# Patient Record
Sex: Female | Born: 1935 | Race: White | Hispanic: No | Smoking: Never smoker
Health system: Southern US, Community
[De-identification: ages and names within clinical notes are randomized; demographics above are authoritative.]

## PROBLEM LIST (undated history)

## (undated) DIAGNOSIS — J449 Chronic obstructive pulmonary disease, unspecified: Secondary | ICD-10-CM

## (undated) DIAGNOSIS — N9489 Other specified conditions associated with female genital organs and menstrual cycle: Secondary | ICD-10-CM

## (undated) DIAGNOSIS — J4489 Other specified chronic obstructive pulmonary disease: Secondary | ICD-10-CM

## (undated) DIAGNOSIS — F329 Major depressive disorder, single episode, unspecified: Secondary | ICD-10-CM

## (undated) DIAGNOSIS — R300 Dysuria: Secondary | ICD-10-CM

## (undated) DIAGNOSIS — K219 Gastro-esophageal reflux disease without esophagitis: Secondary | ICD-10-CM

## (undated) DIAGNOSIS — N952 Postmenopausal atrophic vaginitis: Secondary | ICD-10-CM

## (undated) DIAGNOSIS — F411 Generalized anxiety disorder: Secondary | ICD-10-CM

## (undated) DIAGNOSIS — F0390 Unspecified dementia without behavioral disturbance: Secondary | ICD-10-CM

## (undated) DIAGNOSIS — K5792 Diverticulitis of intestine, part unspecified, without perforation or abscess without bleeding: Secondary | ICD-10-CM

## (undated) DIAGNOSIS — Z8739 Personal history of other diseases of the musculoskeletal system and connective tissue: Secondary | ICD-10-CM

## (undated) DIAGNOSIS — N3020 Other chronic cystitis without hematuria: Secondary | ICD-10-CM

## (undated) DIAGNOSIS — L988 Other specified disorders of the skin and subcutaneous tissue: Secondary | ICD-10-CM

## (undated) DIAGNOSIS — J45909 Unspecified asthma, uncomplicated: Secondary | ICD-10-CM

## (undated) DIAGNOSIS — F3289 Other specified depressive episodes: Secondary | ICD-10-CM

## (undated) DIAGNOSIS — K449 Diaphragmatic hernia without obstruction or gangrene: Secondary | ICD-10-CM

## (undated) DIAGNOSIS — R351 Nocturia: Secondary | ICD-10-CM

## (undated) DIAGNOSIS — R339 Retention of urine, unspecified: Secondary | ICD-10-CM

## (undated) DIAGNOSIS — G4700 Insomnia, unspecified: Secondary | ICD-10-CM

## (undated) DIAGNOSIS — E119 Type 2 diabetes mellitus without complications: Secondary | ICD-10-CM

## (undated) DIAGNOSIS — F068 Other specified mental disorders due to known physiological condition: Secondary | ICD-10-CM

## (undated) HISTORY — DX: Other specified mental disorders due to known physiological condition: F06.8

## (undated) HISTORY — DX: Generalized anxiety disorder: F41.1

## (undated) HISTORY — DX: Other specified depressive episodes: F32.89

## (undated) HISTORY — PX: NISSEN FUNDOPLICATION: SHX2091

## (undated) HISTORY — DX: Gastro-esophageal reflux disease without esophagitis: K21.9

## (undated) HISTORY — DX: Other specified chronic obstructive pulmonary disease: J44.89

## (undated) HISTORY — DX: Personal history of other diseases of the musculoskeletal system and connective tissue: Z87.39

## (undated) HISTORY — DX: Diverticulitis of intestine, part unspecified, without perforation or abscess without bleeding: K57.92

## (undated) HISTORY — DX: Other specified conditions associated with female genital organs and menstrual cycle: N94.89

## (undated) HISTORY — DX: Type 2 diabetes mellitus without complications: E11.9

## (undated) HISTORY — DX: Unspecified asthma, uncomplicated: J45.909

## (undated) HISTORY — PX: OTHER SURGICAL HISTORY: SHX169

## (undated) HISTORY — DX: Other chronic cystitis without hematuria: N30.20

## (undated) HISTORY — DX: Other specified disorders of the skin and subcutaneous tissue: L98.8

## (undated) HISTORY — DX: Insomnia, unspecified: G47.00

## (undated) HISTORY — DX: Retention of urine, unspecified: R33.9

## (undated) HISTORY — DX: Chronic obstructive pulmonary disease, unspecified: J44.9

## (undated) HISTORY — PX: APPENDECTOMY: SHX54

## (undated) HISTORY — DX: Diaphragmatic hernia without obstruction or gangrene: K44.9

## (undated) HISTORY — DX: Nocturia: R35.1

## (undated) HISTORY — DX: Major depressive disorder, single episode, unspecified: F32.9

## (undated) HISTORY — PX: HERNIA REPAIR: SHX51

## (undated) HISTORY — DX: Dysuria: R30.0

## (undated) HISTORY — DX: Postmenopausal atrophic vaginitis: N95.2

## (undated) HISTORY — PX: CYSTOSCOPY: SUR368

## (undated) HISTORY — PX: HEMORRHOID SURGERY: SHX153

---

## 2001-01-08 ENCOUNTER — Ambulatory Visit (HOSPITAL_COMMUNITY): Admission: RE | Admit: 2001-01-08 | Discharge: 2001-01-08 | Payer: Self-pay | Admitting: Cardiology

## 2001-06-18 ENCOUNTER — Other Ambulatory Visit: Admission: RE | Admit: 2001-06-18 | Discharge: 2001-06-18 | Payer: Self-pay | Admitting: Obstetrics and Gynecology

## 2001-07-14 ENCOUNTER — Ambulatory Visit (HOSPITAL_COMMUNITY): Admission: RE | Admit: 2001-07-14 | Discharge: 2001-07-14 | Payer: Self-pay

## 2001-08-02 ENCOUNTER — Ambulatory Visit (HOSPITAL_COMMUNITY): Admission: RE | Admit: 2001-08-02 | Discharge: 2001-08-02 | Payer: Self-pay | Admitting: Internal Medicine

## 2001-09-13 ENCOUNTER — Ambulatory Visit (HOSPITAL_COMMUNITY): Admission: RE | Admit: 2001-09-13 | Discharge: 2001-09-13 | Payer: Self-pay

## 2002-06-27 ENCOUNTER — Encounter: Payer: Self-pay | Admitting: General Surgery

## 2002-06-29 ENCOUNTER — Ambulatory Visit (HOSPITAL_COMMUNITY): Admission: RE | Admit: 2002-06-29 | Discharge: 2002-06-29 | Payer: Self-pay | Admitting: General Surgery

## 2002-09-13 ENCOUNTER — Ambulatory Visit (HOSPITAL_COMMUNITY): Admission: RE | Admit: 2002-09-13 | Discharge: 2002-09-13 | Payer: Self-pay | Admitting: Family Medicine

## 2002-09-13 ENCOUNTER — Encounter: Payer: Self-pay | Admitting: Family Medicine

## 2003-09-20 ENCOUNTER — Ambulatory Visit (HOSPITAL_COMMUNITY): Admission: RE | Admit: 2003-09-20 | Discharge: 2003-09-20 | Payer: Self-pay | Admitting: Family Medicine

## 2004-09-23 ENCOUNTER — Ambulatory Visit (HOSPITAL_COMMUNITY): Admission: RE | Admit: 2004-09-23 | Discharge: 2004-09-23 | Payer: Self-pay | Admitting: Family Medicine

## 2005-07-29 ENCOUNTER — Ambulatory Visit: Payer: Self-pay | Admitting: *Deleted

## 2005-07-29 ENCOUNTER — Inpatient Hospital Stay (HOSPITAL_COMMUNITY): Admission: EM | Admit: 2005-07-29 | Discharge: 2005-08-04 | Payer: Self-pay | Admitting: Emergency Medicine

## 2005-08-25 ENCOUNTER — Ambulatory Visit (HOSPITAL_COMMUNITY): Admission: RE | Admit: 2005-08-25 | Discharge: 2005-08-25 | Payer: Self-pay | Admitting: Family Medicine

## 2005-08-27 ENCOUNTER — Inpatient Hospital Stay: Admission: AD | Admit: 2005-08-27 | Discharge: 2005-09-26 | Payer: Self-pay | Admitting: Internal Medicine

## 2007-08-28 ENCOUNTER — Ambulatory Visit: Payer: Self-pay | Admitting: Cardiovascular Disease

## 2007-08-28 ENCOUNTER — Ambulatory Visit: Payer: Self-pay | Admitting: Critical Care Medicine

## 2007-08-28 ENCOUNTER — Inpatient Hospital Stay (HOSPITAL_COMMUNITY): Admission: EM | Admit: 2007-08-28 | Discharge: 2007-09-24 | Payer: Self-pay | Admitting: Emergency Medicine

## 2007-09-09 ENCOUNTER — Encounter: Payer: Self-pay | Admitting: Emergency Medicine

## 2007-09-09 ENCOUNTER — Ambulatory Visit: Payer: Self-pay | Admitting: Critical Care Medicine

## 2007-09-15 ENCOUNTER — Ambulatory Visit: Payer: Self-pay | Admitting: Physical Medicine & Rehabilitation

## 2008-01-20 ENCOUNTER — Ambulatory Visit (HOSPITAL_COMMUNITY): Admission: RE | Admit: 2008-01-20 | Discharge: 2008-01-20 | Payer: Self-pay | Admitting: Pulmonary Disease

## 2008-06-12 ENCOUNTER — Ambulatory Visit (HOSPITAL_COMMUNITY): Admission: RE | Admit: 2008-06-12 | Discharge: 2008-06-12 | Payer: Self-pay | Admitting: Pulmonary Disease

## 2008-06-14 ENCOUNTER — Ambulatory Visit: Payer: Self-pay | Admitting: Cardiology

## 2008-07-10 IMAGING — CR DG ABD PORTABLE 1V
1 series · 1 of 1 positions shown · non-contrast
Comparison: none

CLINICAL DATA: Feeding tube placement

[view not recorded]
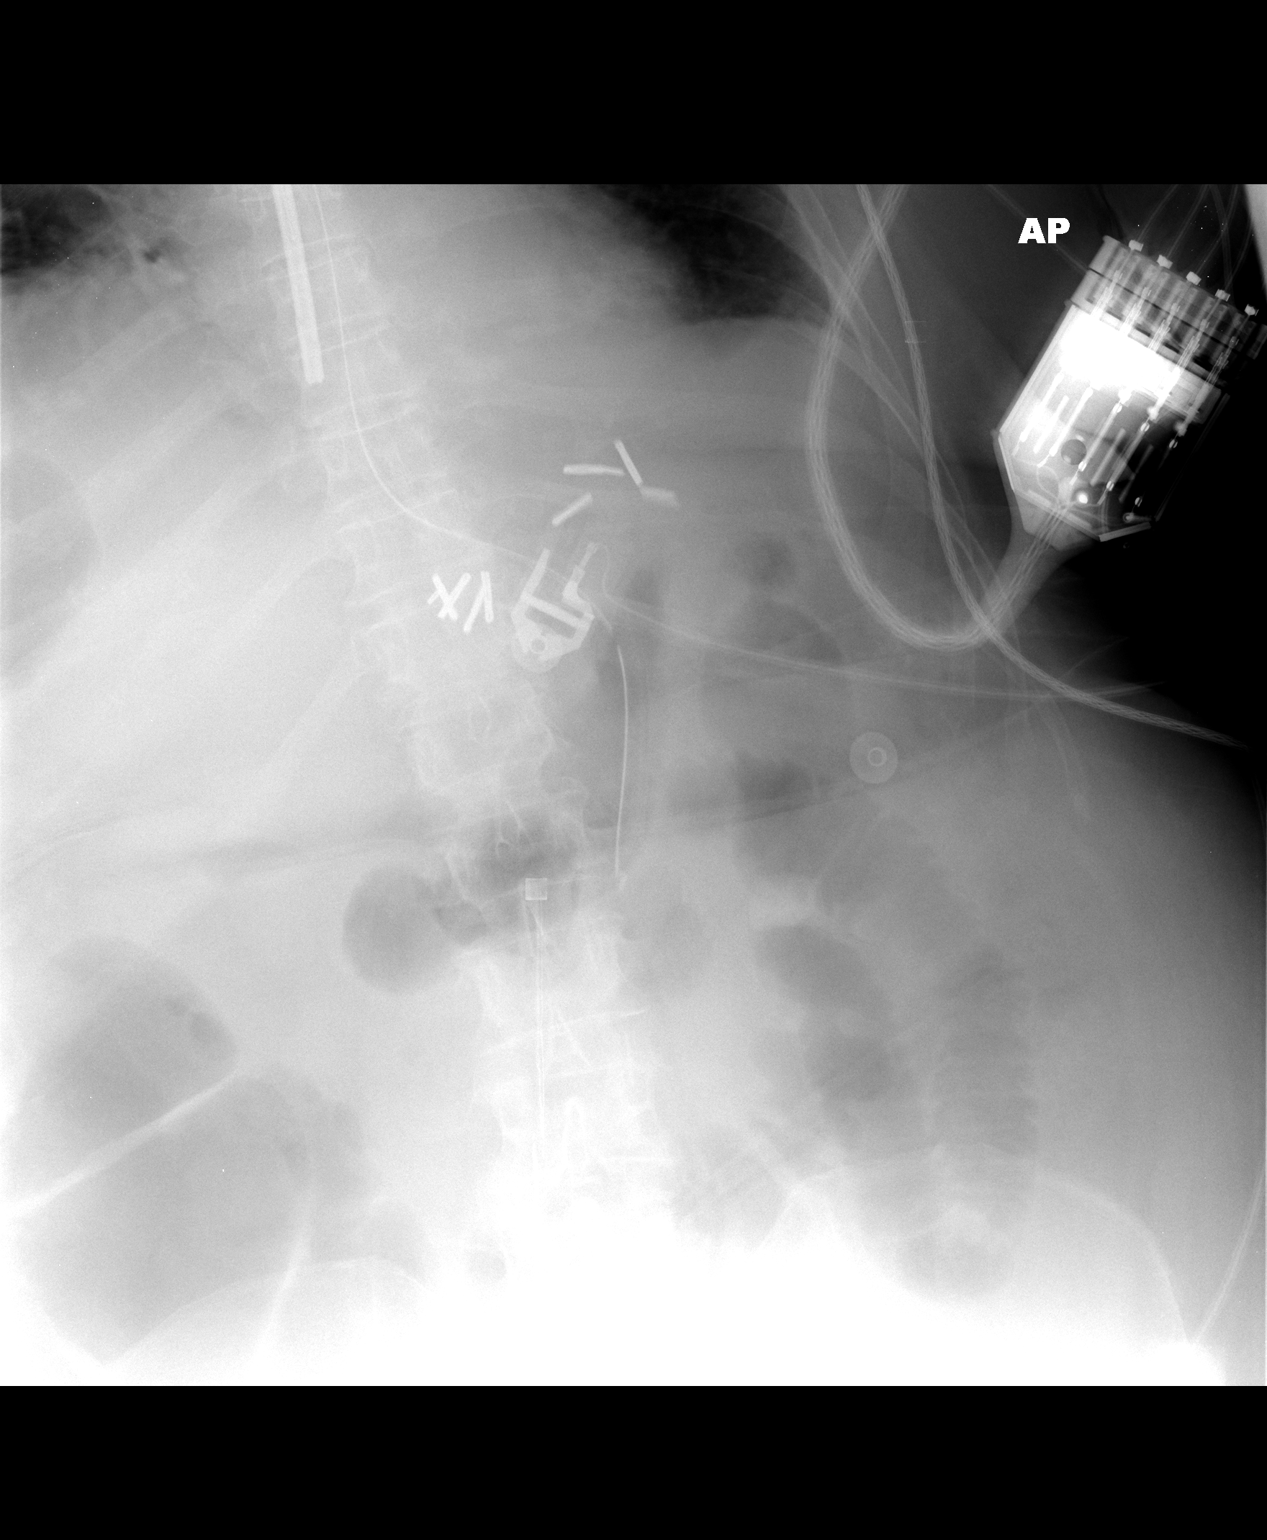

[1 of 1 positions shown; findings below may reference images not displayed]

Portable abdomen at 9101:

Comparison 09/03/2007. Panda type feeding tube tip is in the distal esophagus.
Nasogastric tube extends into decompressed stomach. Vascular clips in the left
upper abdomen. A few gas filled small bowel and colonic loops are evident. The
right lateral and lower abdomen are excluded.
IMPRESSION: 1. Feeding tube tip in the distal esophagus

## 2008-07-11 IMAGING — CR DG CHEST 1V PORT
1 series · 1 of 1 positions shown · non-contrast
Comparison: One day prior

CLINICAL DATA: ARDS. Pneumonia.

CHEST - 1 VIEW

[view not recorded]
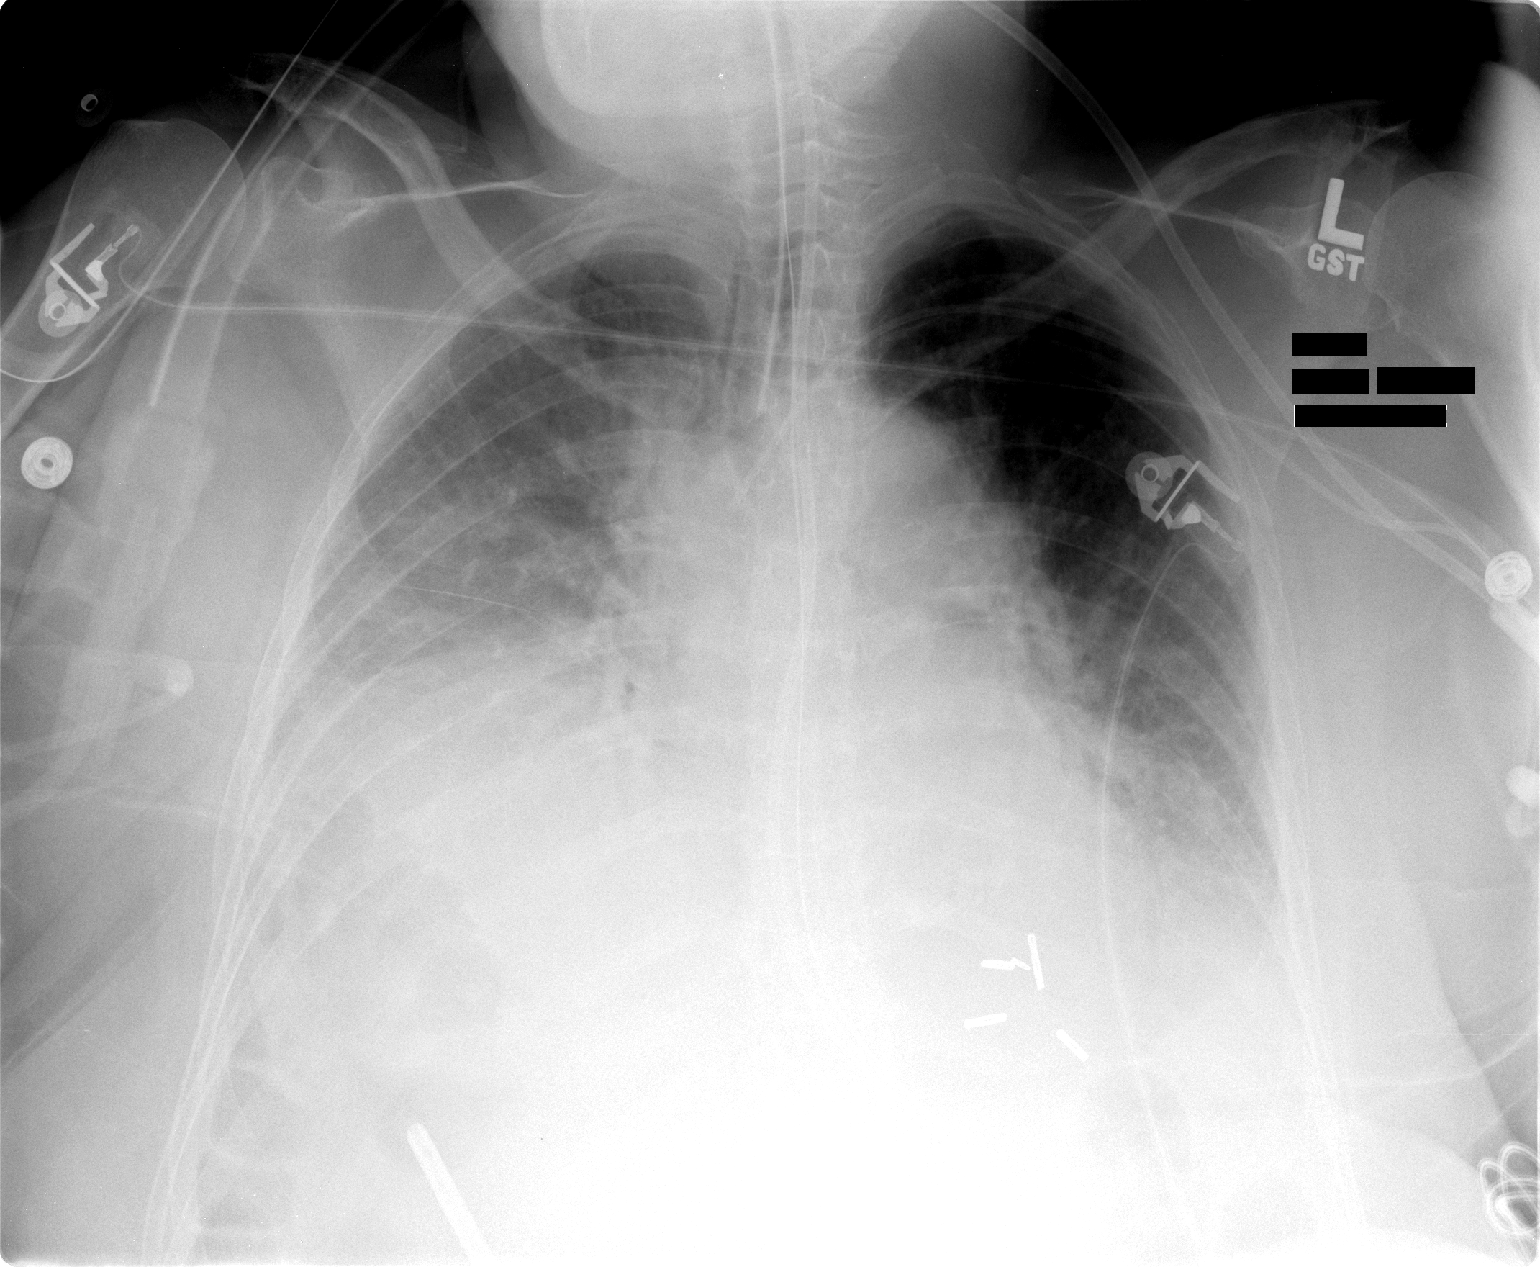

[1 of 1 positions shown; findings below may reference images not displayed]

FINDINGS: Endotracheal tube terminates at level of clavicles. Nasogastric tube
and feeding tube extending beyond inferior aspect of film. Left-sided PICC line
is unchanged.

Mild cardiomegaly. Increased layering right greater than left pleural effusions.
Decreased lung volumes. No pneumothorax. Increased interstitial edema and
bibasilar air space opacities.

IMPRESSION

1. Worsening aeration with increased edema, right greater than left air space
disease, and effusions.
2. No pneumothorax.

## 2008-07-13 IMAGING — CR DG CHEST 1V PORT
1 series · 1 of 1 positions shown · non-contrast
Comparison: 09/10/2007

CLINICAL DATA: Respiratory failure, pneumonia. 
 PORTABLE CHEST - 09/12/2007 AT 1817 HOURS:

[view not recorded]
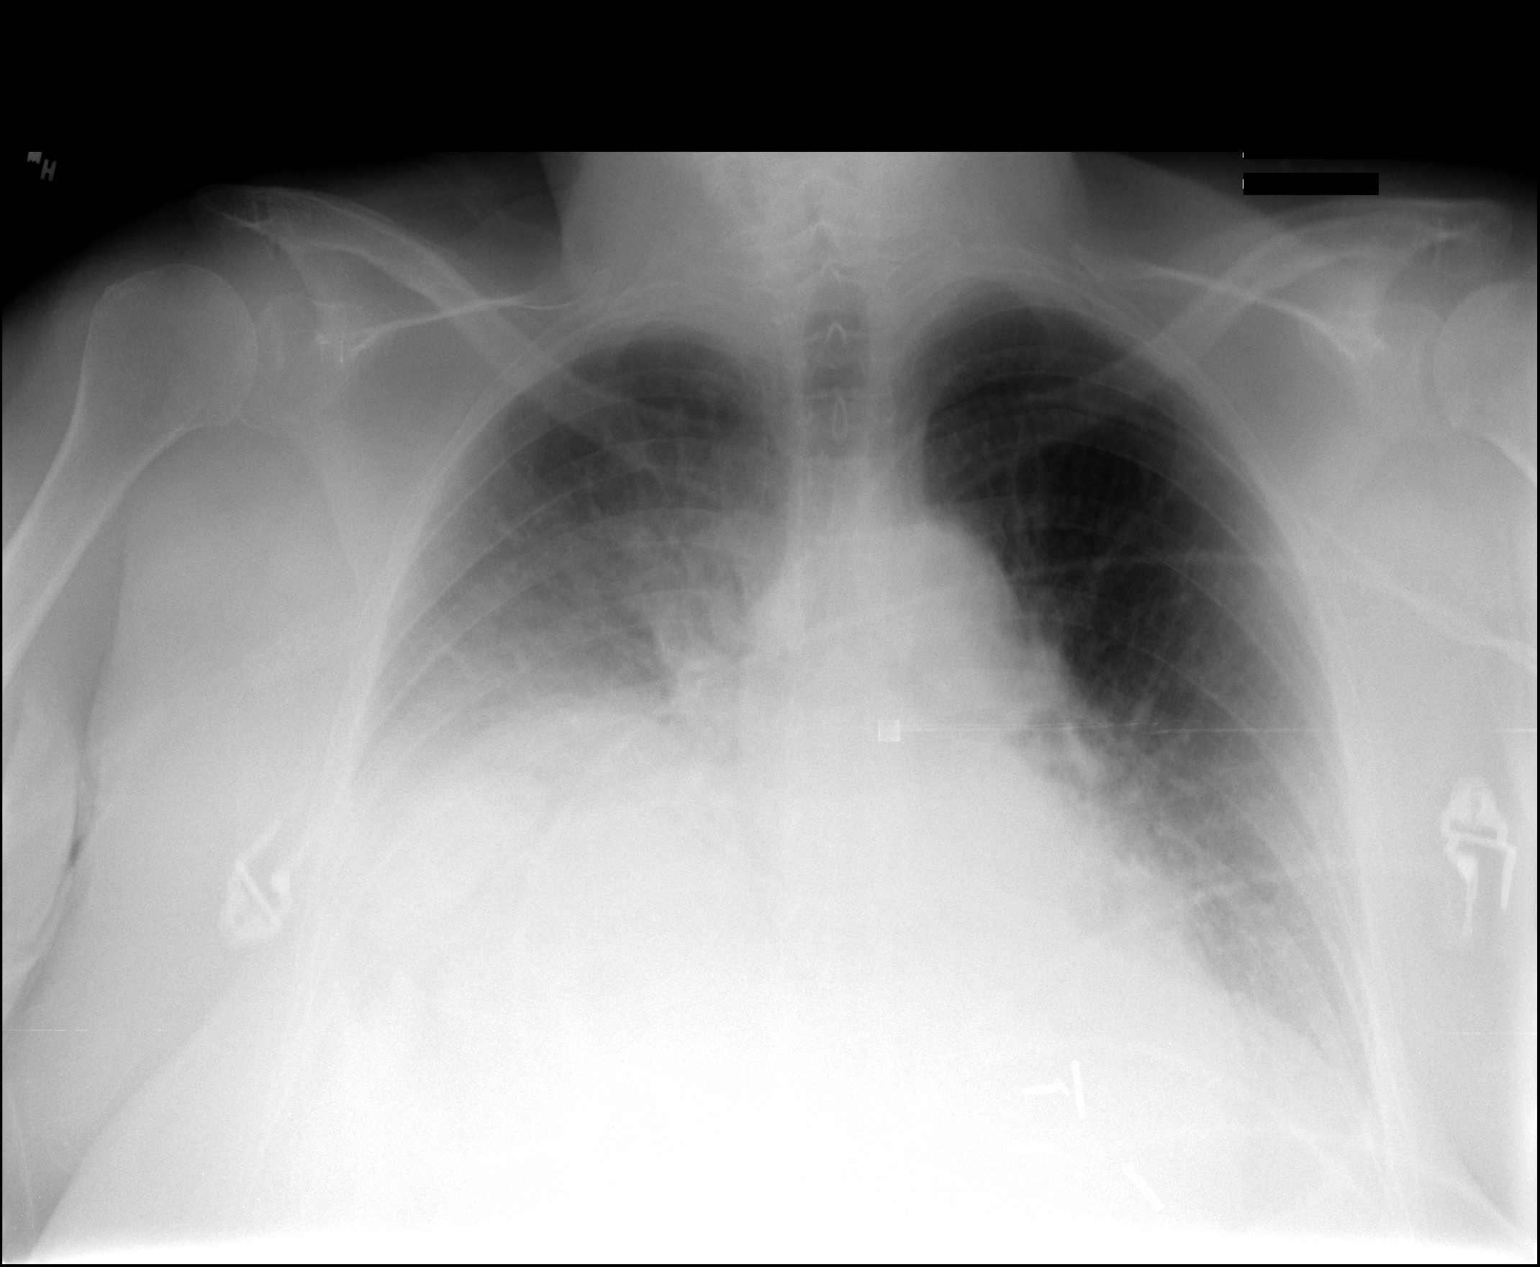

[1 of 1 positions shown; findings below may reference images not displayed]

FINDINGS: Endotracheal tube has been removed.  Central line tip is in the SVC.  There is no pneumothorax. 
 There is mild improvement in aeration of the lungs bilaterally.  There remains airspace disease bilaterally which may be some mild residual edema.  Right hemidiaphragm is elevated and there is bibasilar atelectasis.
IMPRESSION: Endotracheal tube removed.  Mild improvement in aeration.

## 2008-07-14 IMAGING — CR DG CHEST 1V PORT
1 series · 1 of 1 positions shown · non-contrast
Comparison: Chest 1-view, 09/12/07.

CLINICAL DATA: Respiratory failure.  
 PORTABLE CHEST ? 1 VIEW:

[view not recorded]
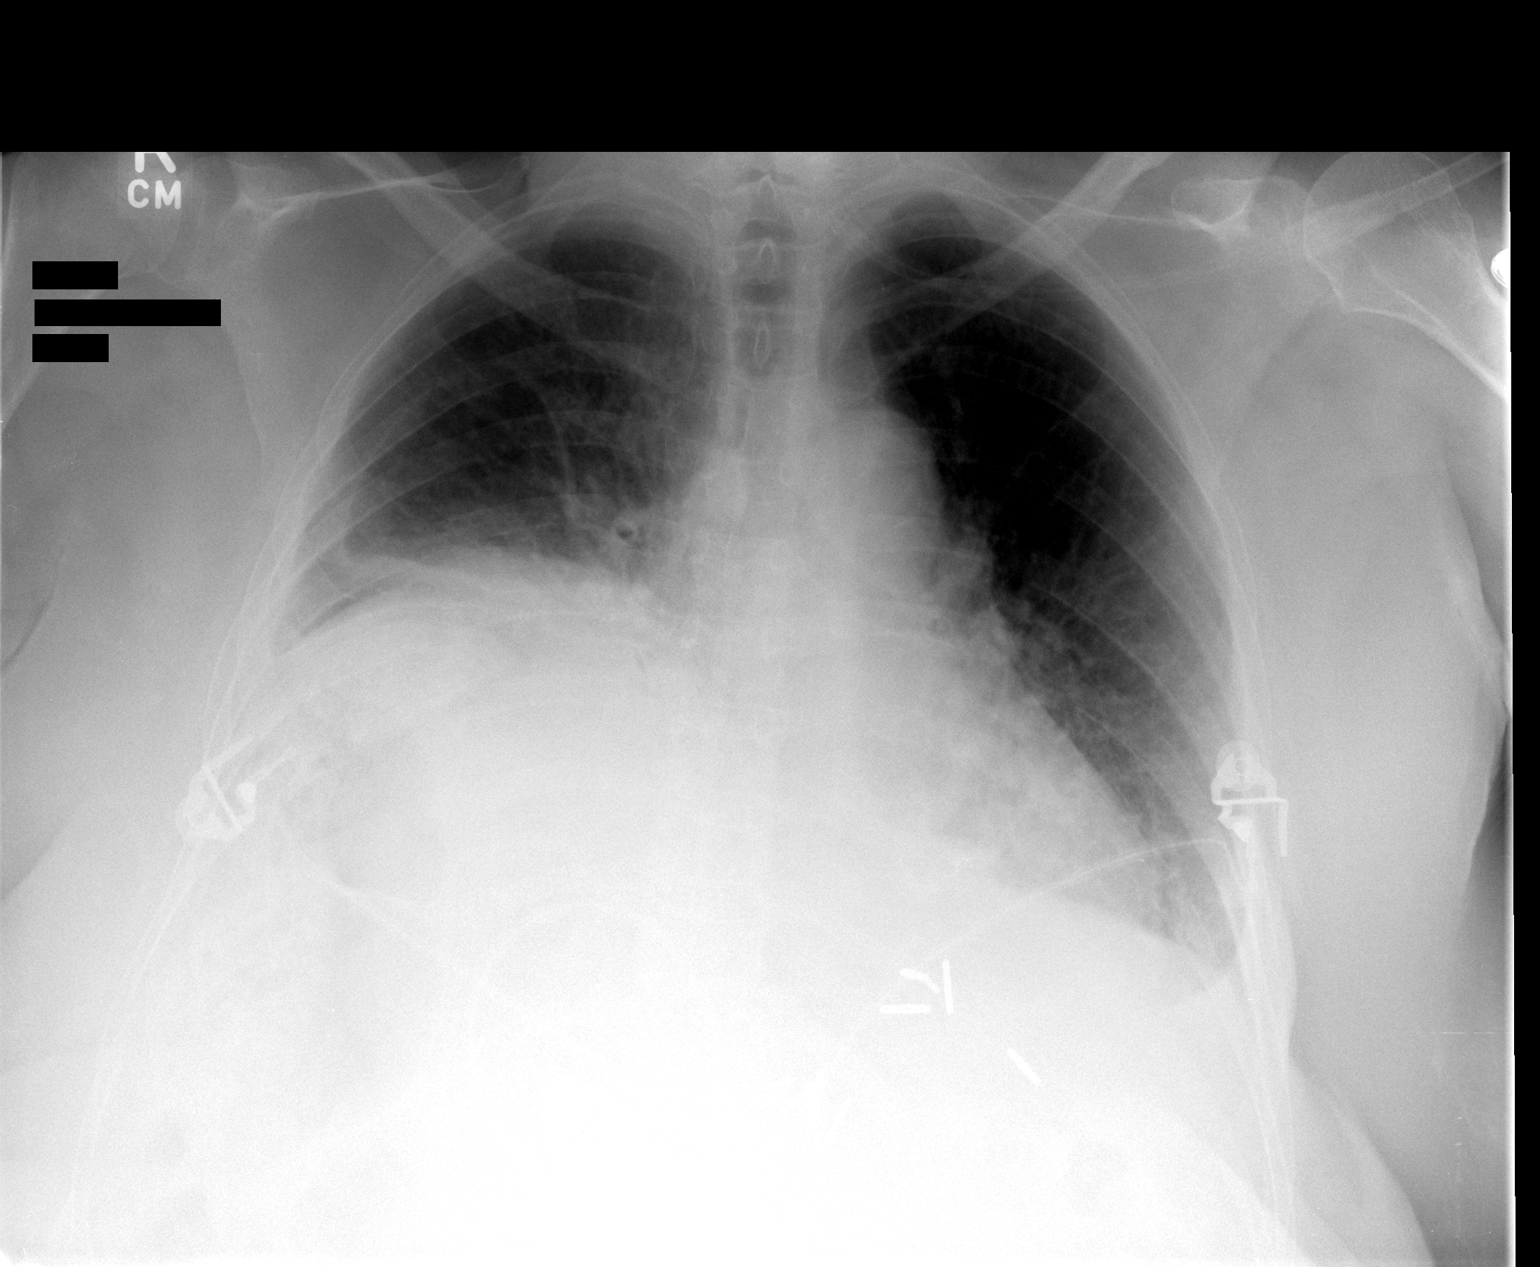

[1 of 1 positions shown; findings below may reference images not displayed]

FINDINGS: Continued elevation of the right hemidiaphragm with associated atelectasis.  Mild central pulmonary congestion unchanged.  Low lung volumes.  Mild cardiomegaly.
IMPRESSION: 1.  Mild decrease in pulmonary vascular congestion.
 2.  Persistent bibasilar atelectasis and low lung volumes.

## 2008-07-15 IMAGING — CR DG CHEST 1V PORT
1 series · 1 of 1 positions shown · non-contrast
Comparison: 09/13/07.

CLINICAL DATA: Respiratory failure/pneumonia/ventilator dependent.  
 PORTABLE CHEST ? 1 VIEW:

[view not recorded]
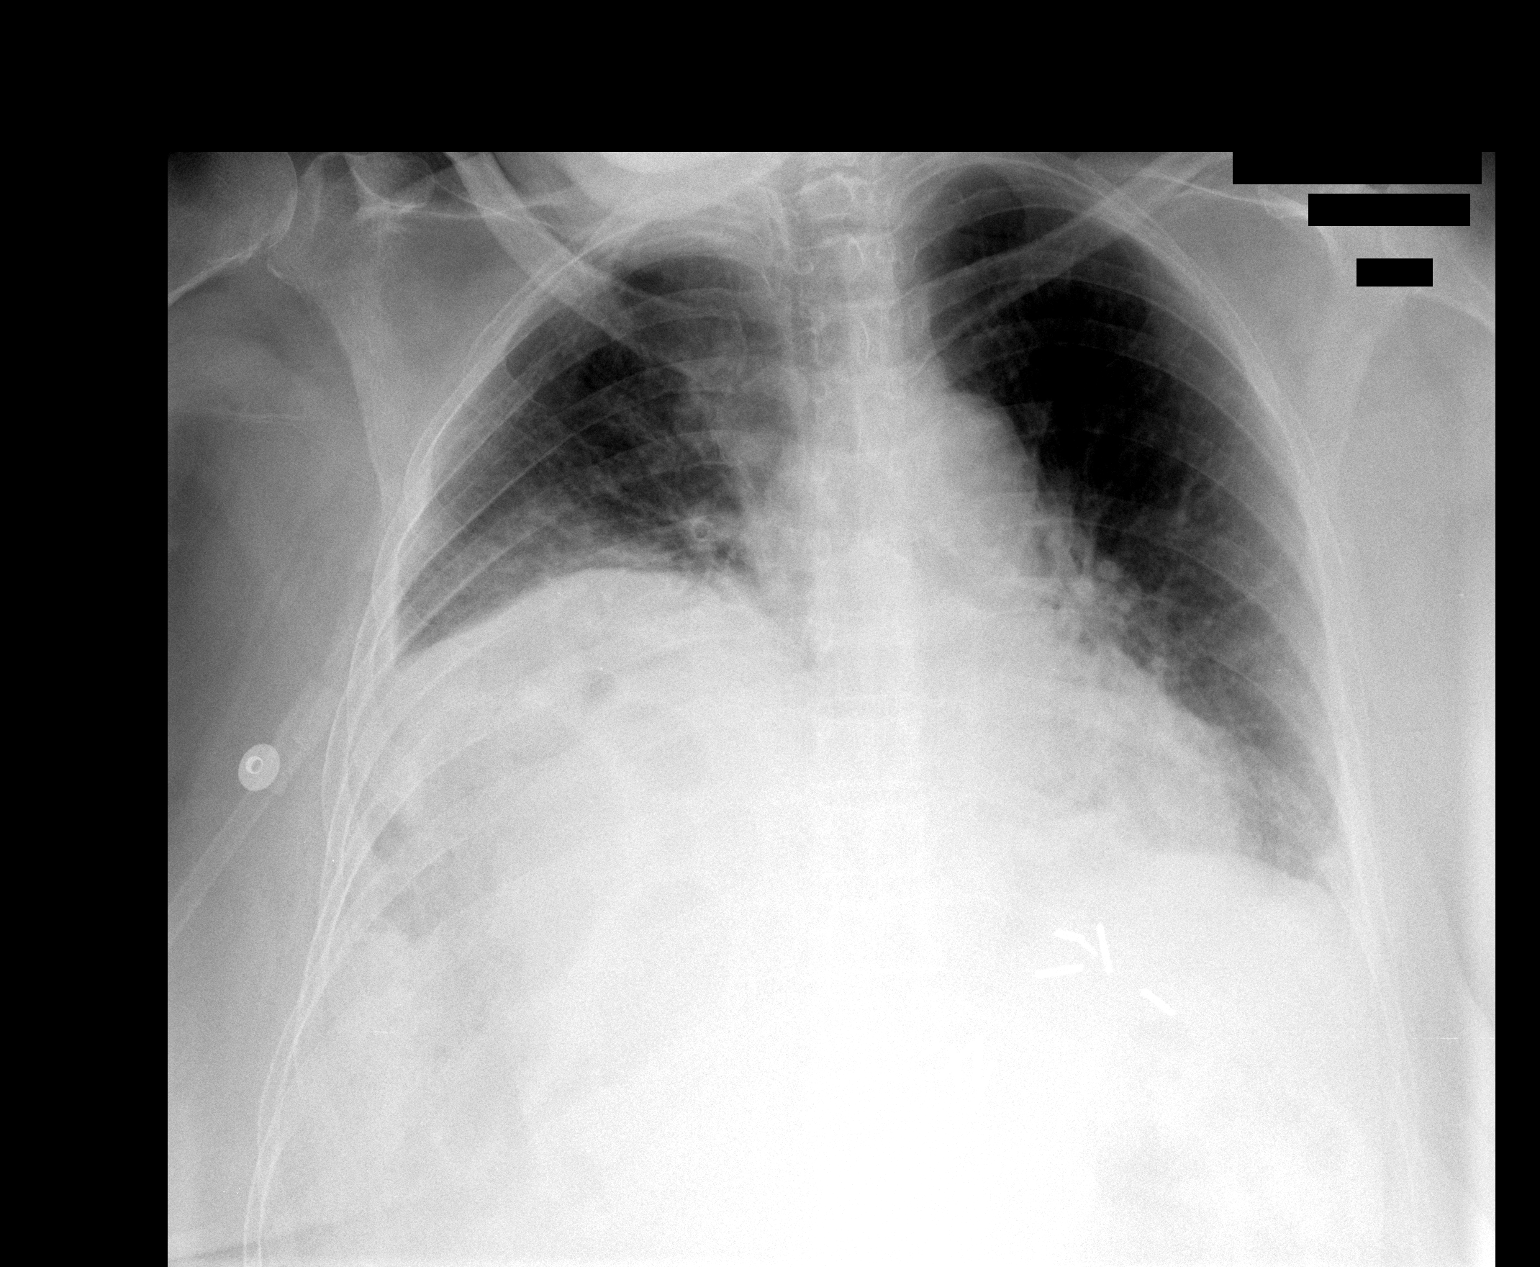

[1 of 1 positions shown; findings below may reference images not displayed]

FINDINGS: Persistent elevation of the right hemidiaphragm with bilateral lower lobe volume loss.  No definite change compared to yesterday?s study.  Perhaps slight interval improvement of right lower lobe aeration.
IMPRESSION: Bilateral lower lobe volume loss with little change.  Perhaps slight improvement at the right base.

## 2008-09-04 ENCOUNTER — Ambulatory Visit (HOSPITAL_COMMUNITY): Admission: RE | Admit: 2008-09-04 | Discharge: 2008-09-04 | Payer: Self-pay | Admitting: Pulmonary Disease

## 2008-10-14 ENCOUNTER — Emergency Department (HOSPITAL_COMMUNITY): Admission: EM | Admit: 2008-10-14 | Discharge: 2008-10-14 | Payer: Self-pay | Admitting: Emergency Medicine

## 2009-12-10 ENCOUNTER — Observation Stay (HOSPITAL_COMMUNITY): Admission: EM | Admit: 2009-12-10 | Discharge: 2009-12-11 | Payer: Self-pay | Admitting: Emergency Medicine

## 2010-12-20 ENCOUNTER — Ambulatory Visit
Admission: RE | Admit: 2010-12-20 | Discharge: 2010-12-20 | Payer: Self-pay | Source: Home / Self Care | Attending: Urology | Admitting: Urology

## 2011-01-24 ENCOUNTER — Ambulatory Visit (INDEPENDENT_AMBULATORY_CARE_PROVIDER_SITE_OTHER): Payer: Medicare Other | Admitting: Urology

## 2011-01-24 DIAGNOSIS — N3020 Other chronic cystitis without hematuria: Secondary | ICD-10-CM

## 2011-01-24 DIAGNOSIS — R300 Dysuria: Secondary | ICD-10-CM

## 2011-01-24 DIAGNOSIS — N952 Postmenopausal atrophic vaginitis: Secondary | ICD-10-CM

## 2011-02-09 LAB — DIFFERENTIAL
Basophils Relative: 1 % (ref 0–1)
Eosinophils Absolute: 0.2 10*3/uL (ref 0.0–0.7)
Lymphs Abs: 1.8 10*3/uL (ref 0.7–4.0)
Monocytes Absolute: 1.1 10*3/uL — ABNORMAL HIGH (ref 0.1–1.0)
Monocytes Relative: 12 % (ref 3–12)
Neutro Abs: 6.4 10*3/uL (ref 1.7–7.7)

## 2011-02-09 LAB — BASIC METABOLIC PANEL
CO2: 22 mEq/L (ref 19–32)
Calcium: 8.7 mg/dL (ref 8.4–10.5)
Creatinine, Ser: 1.07 mg/dL (ref 0.4–1.2)
GFR calc Af Amer: 59 mL/min — ABNORMAL LOW (ref >60)
GFR calc non Af Amer: 49 mL/min — ABNORMAL LOW (ref >60)
Glucose, Bld: 142 mg/dL — ABNORMAL HIGH (ref 70–99)
Potassium: 3.9 mEq/L (ref 3.5–5.1)
Sodium: 138 mEq/L (ref 135–145)

## 2011-02-09 LAB — RAPID URINE DRUG SCREEN, HOSP PERFORMED
Barbiturates: NOT DETECTED
Cocaine: NOT DETECTED
Opiates: POSITIVE — AB
Tetrahydrocannabinol: NOT DETECTED

## 2011-02-09 LAB — GLUCOSE, CAPILLARY: Glucose-Capillary: 143 mg/dL — ABNORMAL HIGH (ref 70–99)

## 2011-02-09 LAB — URINALYSIS, ROUTINE W REFLEX MICROSCOPIC
Glucose, UA: NEGATIVE mg/dL
Nitrite: NEGATIVE
Urobilinogen, UA: 0.2 mg/dL (ref 0.0–1.0)

## 2011-02-09 LAB — URINE CULTURE

## 2011-02-09 LAB — CBC
Hemoglobin: 13.4 g/dL (ref 12.0–15.0)
MCV: 94.6 fL (ref 78.0–100.0)
RBC: 4.19 MIL/uL (ref 3.87–5.11)
WBC: 9.5 10*3/uL (ref 4.0–10.5)

## 2011-03-14 ENCOUNTER — Ambulatory Visit (INDEPENDENT_AMBULATORY_CARE_PROVIDER_SITE_OTHER): Payer: Medicare Other | Admitting: Urology

## 2011-03-14 ENCOUNTER — Other Ambulatory Visit: Payer: Self-pay | Admitting: Urology

## 2011-03-14 DIAGNOSIS — N3020 Other chronic cystitis without hematuria: Secondary | ICD-10-CM

## 2011-03-14 DIAGNOSIS — R3989 Other symptoms and signs involving the genitourinary system: Secondary | ICD-10-CM

## 2011-03-14 DIAGNOSIS — N3946 Mixed incontinence: Secondary | ICD-10-CM

## 2011-03-17 ENCOUNTER — Ambulatory Visit (HOSPITAL_COMMUNITY)
Admission: RE | Admit: 2011-03-17 | Discharge: 2011-03-17 | Disposition: A | Discharge status: Home / Self Care | Payer: Medicare Other | Source: Ambulatory Visit | Attending: Urology | Admitting: Urology

## 2011-03-17 DIAGNOSIS — N200 Calculus of kidney: Secondary | ICD-10-CM | POA: Insufficient documentation

## 2011-03-17 DIAGNOSIS — N3020 Other chronic cystitis without hematuria: Secondary | ICD-10-CM

## 2011-03-17 DIAGNOSIS — R1012 Left upper quadrant pain: Secondary | ICD-10-CM | POA: Insufficient documentation

## 2011-03-17 DIAGNOSIS — K5730 Diverticulosis of large intestine without perforation or abscess without bleeding: Secondary | ICD-10-CM | POA: Insufficient documentation

## 2011-04-04 ENCOUNTER — Ambulatory Visit (INDEPENDENT_AMBULATORY_CARE_PROVIDER_SITE_OTHER): Payer: Medicare Other | Admitting: Urology

## 2011-04-04 DIAGNOSIS — N200 Calculus of kidney: Secondary | ICD-10-CM

## 2011-04-04 DIAGNOSIS — N3020 Other chronic cystitis without hematuria: Secondary | ICD-10-CM

## 2011-04-08 NOTE — Group Therapy Note (Signed)
NAMENIANNA, IGO              ACCOUNT NO.:  [id number]   MEDICAL RECORD NO.:  [id number]          PATIENT TYPE:  INP   LOCATION:  IC04                          FACILITY:  [facility]   PHYSICIAN:  Edward L. [name], M.D.DATE OF BIRTH:  [birth year]   DATE OF PROCEDURE:  DATE OF DISCHARGE:                                 PROGRESS NOTE   Patient of Dr. Janna Arch.  [name] remains intubated on the  ventilator.  She has no new problems noted through the night.  She is  sedated.   PHYSICAL EXAMINATION:  This morning her pulse is in the 70s, blood  pressure 126/90.  Her chest shows still some rhonchi.  Her heart is regular.  She seems to have a little bit less peripheral edema.  CENTRAL NERVOUS SYSTEM:  Examination grossly intact although she is  sedated.   Her potassium is 3.3, CO2 37, glucose 127, BUN 33 creatinine 0.89.  Albumin is up to 2.6.  She did receive albumin yesterday.  White blood  count 20,600, hemoglobin 11.2, platelets 182,000.  Her chest x-ray looks  better today.   ASSESSMENT:  I think she is better, but will plan to go ahead and have  her get more albumin today.  Continue Lasix.  She is going to need runs  of potassium.  Continue with tube feedings and continue with the acute  respiratory distress syndrome protocol for ventilation.      Edward L. [name], M.D.  Electronically Signed     ELH/MEDQ  D:  09/03/2007  T:  09/03/2007  Job:  [id number]

## 2011-04-08 NOTE — Group Therapy Note (Signed)
[name], [name]              ACCOUNT NO.:  [id number]   MEDICAL RECORD NO.:  [id number]          PATIENT TYPE:  INP   LOCATION:  IC04                          FACILITY:  [facility]   PHYSICIAN:  Edward L. [name], M.D.DATE OF BIRTH:  [birth year]   DATE OF PROCEDURE:  DATE OF DISCHARGE:                                 PROGRESS NOTE   [name] has had a number of changes through the night.  She is now  off of Levophed.  Her blood pressure still maintaining.  She has had  excellent urine output and her chest x-ray looks a little better.  She  is now on 90% O2.  We are still following ARDS protocol although we  varied from it a bit yesterday evening.  She has had some recruitment  procedures would seemed to have helped some.   PHYSICAL EXAMINATION:  Her blood pressure about 104 by __________ over  50 or so.  Pulses in the 80s, respirations are 18.  Her O2 sat is in the  90s.  Her chest is clearer than it has been.  Her heart is regular.  Her abdomen soft.  She has trace to 1+ edema of the legs, some of her hands.   Her chest x-ray looks better, less fluid.  Her temperature was about 100  degrees Fahrenheit.  Respiratory culture, no growth.  CBC shows white  count 18,800, hemoglobin is 12.3, platelets 180,000.  Blood gas, on 90%,  14 of PEEP, pH 7.58, pCO2 of 35, pO2 of 59.  Her BMET shows potassium is  3.1, BUN 31, creatinine 0.96, glucose of 186.   ASSESSMENT:  She has Klebsiella pneumonia.  She has acute respiratory  distress syndrome and response to that.  She has respiratory failure.  She had been hypotensive and septic.  That is better now.  She is  hypokalemic which is going to be replaced.  She has got diabetes, which  is being treated.  At this point I think things have improved.  She is  down to 90% O2.  She has had excellent urine output.  Her chest x-ray  looks better.  She is off the Levophed, and she sounds a bit better.  She is fairly stable as far as white blood cell  count.  She has still  got some fever.  She is still requiring high levels of PEEP and high  FIO2 to maintain adequate oxygenation.      Edward L. [name], M.D.  Electronically Signed     ELH/MEDQ  D:  09/01/2007  T:  09/01/2007  Job:  [id number]

## 2011-04-08 NOTE — Group Therapy Note (Signed)
NAMESHARLEY, KEELER              ACCOUNT NO.:  [id number]   MEDICAL RECORD NO.:  [id number]          PATIENT TYPE:  INP   LOCATION:  IC04                          FACILITY:  [facility]   PHYSICIAN:  Edward L. [name], M.D.DATE OF BIRTH:  11-28-1922   DATE OF PROCEDURE:  09/02/2007  DATE OF DISCHARGE:                                 PROGRESS NOTE   Ms. Mirando had more weaning during the night and actually got down to an  FIO2 of 60% but her PO2 was not adequate to sustain her there so she is  back up on 70% this morning.  Her O2 sat is in the mid 90s on 70%.  Her  blood pressure is still excellent in the 120 systolic range, heart rate  is about 90.  CHEST:  Shows markedly decreased breath sounds.  HEART:  Regular without gallop.  ABDOMEN:  Soft.  She has peripheral edema fairly diffusely.   Her chest x-ray shows what looks like increasing edema.  Laboratory work  of note, her albumin is 1.7, She has not had a new blood gas on her 70%  O2, white count is about 18,000.   ASSESSMENT:  Although she remains on 70% O2 and has probably an adequate  blood gas we do not have the blood gas back yet.  Her chest x-ray shows  increased infiltrates and I suspect a lot of this is related to her very  low albumin level.  She is on tube feedings and is not having any  residual so she seems to be tolerating it.  I am going to plan to go  ahead and put her on some albumin followed by Lasix and see if we can  diurese her and perhaps increase her albumin level.  Will continue  trying to wean in the meantime.      Edward L. [name], M.D.  Electronically Signed     ELH/MEDQ  D:  09/02/2007  T:  09/02/2007  Job:  [id number]

## 2011-04-08 NOTE — Group Therapy Note (Signed)
[name], [name]              ACCOUNT NO.:  [id number]   MEDICAL RECORD NO.:  [id number]          PATIENT TYPE:  INP   LOCATION:  IC04                          FACILITY:  [facility]   PHYSICIAN:  Edward L. [name], M.D.DATE OF BIRTH:  1923-03-03   DATE OF PROCEDURE:  09/07/2007  DATE OF DISCHARGE:                                 PROGRESS NOTE   Ms. [name] is about the same.  She had some desaturation last night and  had to go back up on 50% but this morning her PO2 is in the 90s on 40%.  Part of the problem may be that we had difficulty with keeping her  sedated yesterday and she was not able to do her recruitment maneuvers  properly.  She is otherwise about the same.   PHYSICAL EXAM:  This morning shows that she is sedated now.  Her pulse  is in the 80s, blood pressure about 140/70, her O2 sat is in the high  90s.  CHEST:  Shows some rhonchi, not a great deal.  HEART:  Regular.  ABDOMEN:  Soft.  Blood gas this morning shows on rate of 17, PEEP of 8, tidal volume 360  on 50%, pH 7.44, PCO2 of 48, PO2 of 99 so we are going to try to wean  further, especially on the oxygen.  Her electrolytes are normal with the  exception of a CO2 of 36, her glucose 133, BUN is 35 with a creatinine  of 0.74, albumin is 2.6.  CBC:  White count 24,300, hemoglobin 11.3, 93%  neutrophils.  Chest x-ray essentially unchanged.   PLAN:  To continue trying to wean, I am hopeful of her getting her to a  situation that she might be able to actually attempt some weaning from  the ventilator.  I am hoping to get her down to 40%, 5 of PEEP and see  how she does.  I have told the family this is unlikely to occur today,  will try the recruitment maneuvers, try to keep her sedated.  I will  repeat her albumin in the morning.  If it continues to drop, which it  may, I am probably going to give her some more albumin, that has seemed  to have helped.      Edward L. [name], M.D.  Electronically Signed     ELH/MEDQ  D:  09/07/2007  T:  09/07/2007  Job:  [id number]

## 2011-04-08 NOTE — Procedures (Signed)
[name], [name]              ACCOUNT NO.:  [id number]   MEDICAL RECORD NO.:  [id number]          PATIENT TYPE:  INP   LOCATION:  IC04                          FACILITY:  [facility]   PHYSICIAN:  Peter C. Eden Emms, MD, FACCDATE OF BIRTH:  [birth year]   DATE OF PROCEDURE:  DATE OF DISCHARGE:                                ECHOCARDIOGRAM   A 2D echocardiogram.   INDICATIONS:  Acute respiratory failure, check LV function.   Left ventricular cavity size was normal.  There was mild LVH.  Septal  thickness was 10 mm.  EF was 60%.  There was moderate posterior annular  calcification of the mitral valve with mild MR.  Aortic valve was  trileaflet.  Aortic valve was somewhat poorly visualized and was  sclerotic.  There was no evidence of stenosis.  Aortic root was poorly  visualized.  There was mild left atrial enlargement.  Right-sided  cardiac chambers were poorly visualized, but there was no evidence of  cor pulmonale.  Subcostal imaging was also poor.  There was no  pericardial effusion, no obvious ASD and no source of embolus.   FINAL IMPRESSION:  1. Technically difficult study.  2. Mild left ventricular hypertrophy, ejection fraction 60%.  3. MAC with mild mitral regurgitation.  4. Aortic valve sclerosis.  5. Mild left atrial enlargement.  6. Normal right ventricle.  7. No pericardial effusion.   TR velocities could not be obtained to assess PA pressure.      [name]. Eden Emms, MD, [facility]  Electronically Signed     PCN/MEDQ  D:  08/31/2007  T:  08/31/2007  Job:  [id number]

## 2011-04-08 NOTE — H&P (Signed)
NAMEDHANYA, BOGLE              ACCOUNT NO.:  [id number]   MEDICAL RECORD NO.:  [id number]          PATIENT TYPE:  INP   LOCATION:  IC04                          FACILITY:  [facility]   PHYSICIAN:  Tesfaye D. Felecia Shelling, MD   DATE OF BIRTH:  August 13, 1923   DATE OF ADMISSION:  08/28/2007  DATE OF DISCHARGE:  LH                              HISTORY & PHYSICAL   CHIEF COMPLAINT:  Shortness of breath.   HISTORY OF PRESENT ILLNESS:  This is an 77 year old, female patient with  history of multiple medical illnesses who was brought to the emergency  room with above complaint.  The patient developed shortness of breath  since yesterday.  She has been using her inhaler frequently, however,  continued to have shortness of breath.  She had also fever and cough.  No chest pain, nausea, vomiting, abdominal pain, dysuria, urgency or  frequency of urination.  The patient was evaluated in the emergency room  and she was found to have labored breathing.  Her oxygen saturation was  very low.  She was put on oxygen and a nebulizer treatment was given.  Her WBCs showed leukocytosis.  Her chest x-ray also, according to the ER  physician, had infiltrate in the right lower lobe.  The patient was  started on IV antibiotics and was admitted to the floor.   PAST MEDICAL HISTORY:  1. History of bronchial asthma.  2. Gastroesophageal reflux disease.  3. Diabetes mellitus currently controlled on diet.  4. History of multiple falls.  5. History of hypokalemia.  6. History of hypertension.   CURRENT MEDICATIONS:  1. Oxybutynin 10 mg daily.  2. Vicodin 5/500 one tablet p.o. t.i.d.  3. Lasix 20 mg daily.  4. Nexium 40 mg daily.  5. Albuterol nebulizer q.4 h. as needed.   PHYSICAL EXAMINATION:  GENERAL:  The patient is alert, awake and acutely  sick looking.  VITAL SIGNS:  Blood pressure 100/63, pulse 112, respiratory rate 22,  temperature 101 degrees Fahrenheit.  HEENT:  Pupils are equal and reactive.  NECK:   Supple.  CHEST:  Poor air entry, bilateral diffuse expiratory wheezes.  CARDIAC:  S1, S2 heard.  Tachycardiac.  ABDOMEN:  Soft and lax.  Bowel sounds positive.  No mass or  organomegaly.  EXTREMITIES:  No leg edema.   LABORATORY DATA AND X-RAY FINDINGS:  BMP with sodium 134, potassium 4.4,  chloride 102, carbon dioxide 22, glucose 158, BUN 25, creatinine 1.8,  calcium 9.2.  CBC with WBC 30.2, hemoglobin 13.9, hematocrit 40.9 and  platelet 216.  BNP 272.  ABG on 2 L nasal with pH 7.42, pCO2 26, pO2 55  and saturation 88.   ASSESSMENT:  This is an 77 year old, female patient with acute  exacerbation of bronchial asthma.  The patient probably has also right  lower lobe pneumonia.   PLAN:  We will start the patient on combination of IV Rocephin and  Zithromax.  We will continue nebulizer treatments.  Will continue oxygen  therapy.  Will also start the patient on IV steroids.  Will continue her  regular medications.  Will continue  to monitor the patient closely,  especially her oxygen saturation.  If the patient has sign of distress  or desaturation, I will go ahead and intubate the patient.  I have  discussed with the patient about possibility of intubation.  She has  agreed for intubation and also has indicated that her family will decide  if she ever becomes unable to be weaned from ventilatory support to  withdraw life support.      Tesfaye D. Felecia Shelling, MD  Electronically Signed     TDF/MEDQ  D:  08/28/2007  T:  08/29/2007  Job:  [id number]

## 2011-04-08 NOTE — Group Therapy Note (Signed)
NAMEANJANA, [name]              ACCOUNT NO.:  [id number]   MEDICAL RECORD NO.:  [id number]          PATIENT TYPE:  INP   LOCATION:  IC04                          FACILITY:  [facility]   PHYSICIAN:  Edward L. [name], M.D.DATE OF BIRTH:  11/07/1923   DATE OF PROCEDURE:  DATE OF DISCHARGE:                                 PROGRESS NOTE   [name] is overall about the same.  She was able to maintain 40% O2  through the night last night, however, which is an improvement.  This  morning she is sedated, intubated, and on the ventilator.   Her pulse is 71, blood pressure 111/52, O2 sat 96%, CVP is 11, pulse 70.  Her chest is fairly clear with somewhat decreased breath sounds.  Her heart is regular without gallop.  Her abdomen is soft.  Extremities showed no edema.   LABORATORY WORK:  Blood gas on FIO2 of 40, 360 rate of 17, shows pH  7.45, pCO2 of 36, pO2 of 61.  BMET, glucose 149, BUN 26, otherwise  normal.  CBC shows white count down to 17,400, hemoglobin is 10.9,  platelets 249,000, and her chest x-ray looks about the same, perhaps  improved a little bit.   ASSESSMENT:  She is overall, I think, better.   PLAN:  Continue to attempt weaning.  We are actually going to try some  weaning today.  I do not expect that she will be able to come off the  ventilator, but at least we will be able to see where she stands.      Edward L. [name], M.D.  Electronically Signed     ELH/MEDQ  D:  09/09/2007  T:  09/09/2007  Job:  [id number]

## 2011-04-08 NOTE — Group Therapy Note (Signed)
[name], [name]              ACCOUNT NO.:  [id number]   MEDICAL RECORD NO.:  [id number]          PATIENT TYPE:  INP   LOCATION:  IC04                          FACILITY:  [facility]   PHYSICIAN:  Edward L. [name], M.D.DATE OF BIRTH:  [birth year]   DATE OF PROCEDURE:  08/30/2007  DATE OF DISCHARGE:                                 PROGRESS NOTE   Ms. Mahan remains intubated and on the ventilator.  Her arterial line  has come out.  She is on the sepsis protocol.  However, she continues  with low blood pressures now 91/48, pulse 86, her temperature max 37.6  or 99.7.  CHEST:  Actually fairly clear right now.  HEART:  Regular without gallop.  ABDOMEN:  Soft.   Her white blood count this morning is 21,300 down from over 30,000 on  admission, hemoglobin is 12.1, PT/INR 19.4 and 1.6 respectively.  Her I  and O yesterday +3746,  positive 671 so far today.  Her weight 92 kg, up  from 89 kg.  BUN is 28, creatinine 1.16, potassium is 3.5.  Her blood  gas shows pH 7.41, PCO2 of 35, PO2 of 69.4 now on 80% rate of 14, 600  tidal volume, 10 of PEEP.   ASSESSMENT:  She has gram-negative rods in the blood.  She has  pneumonia.  She is obviously acutely and critically ill.   PLAN:  To continue with her treatments medications and the art line put  back in, get another chest x-ray today.  There is little else to add.      Edward L. [name], M.D.  Electronically Signed     ELH/MEDQ  D:  08/30/2007  T:  08/30/2007  Job:  [id number]

## 2011-04-08 NOTE — Discharge Summary (Signed)
[name], [name]              ACCOUNT NO.:  [id number]   MEDICAL RECORD NO.:  [id number]          PATIENT TYPE:  INP   LOCATION:  6705                         FACILITY:  [facility]   PHYSICIAN:  Charlcie Cradle. Delford Field, MD, FCCPDATE OF BIRTH:  [birth year]   DATE OF ADMISSION:  09/09/2007  DATE OF DISCHARGE:  09/21/2007                               DISCHARGE SUMMARY   ADDENDUM:   DISCHARGE DIAGNOSES:  Please see extensive discharge summary completed  on September 16, 2007.  Please add to the discharge diagnosis list:   Problem 1.  ORAL CANDIDIASIS:  Currently on day #4 of 10 of Diflucan.  She will be done with this on October 01, 2007.   Problem 2.  DYSPHAGIA:  A repeat barium swallow was completed by speech  therapy.  The patient is making slow improvements.  Plan is now to place  the patient on a dysphagia I nectar-thick diet.  She was cleared for ice  creams but still requires full precautions and observation during p.o.  meals secondary to impulsivity and the high risk for aspiration.   Problem 3.  CHRONIC OBSTRUCTIVE PULMONARY DISEASE:  The patient  continues to make improvement from a COPD standpoint.  She is longer in  exacerbation.  She has completed a prednisone taper and will be placed  on scheduled bronchodilators as well as scheduled inhaled  corticosteroids and continuous oxygen.   Updated discharge medication list as follows:  1. Atrovent 0.5 mg inhaled q.i.d.  2. Budesonide 0.25 mg inhaled q.i.d.  3. Diflucan 100 mg p.o. daily, to be discontinued on October 01, 2007.  4. Ditropan XL 10 mg p.o. daily.  5. Ensure Vanilla Pudding one serving three times a day.  6. Folic acid 1 mg daily.  7. Lovenox 40 mg subcu daily.  8. Maxitrol ophthalmic suspension two drops OP q.i.d.  9. Protonix 40 mg p.o. daily.  10.Seroquel 25 mg p.o. q.h.s.  11.B1 100 mg p.o. daily.  12.Xopenex 0.63 mg inhaled q.i.d.  13.Mycostatin 100,000 units applied topically to sacral area.  14.Thick-It  instant food thickener applied to liquids for nectar-thick      consistency.  15.Tylenol 650 mg p.o. q.6h. p.r.n.   Diet, again, has been changed to dysphagia I, nectar-thick, with full  supervision.  She is cleared for ice cream per recommendations of speech  therapy.  Again, [name] remains cleared for discharge to skilled  nursing facility.      Helen Resides, NP      Charlcie Cradle. Delford Field, MD, [facility]  Electronically Signed    PB/MEDQ  D:  09/21/2007  T:  09/21/2007  Job:  [id number]

## 2011-04-08 NOTE — Discharge Summary (Signed)
[name], [name]              ACCOUNT NO.:  [id number]   MEDICAL RECORD NO.:  [id number]          PATIENT TYPE:  INP   LOCATION:  6712                         FACILITY:  [facility]   PHYSICIAN:  Charlcie Cradle. Delford Field, MD, FCCPDATE OF BIRTH:  November 30, 1922   DATE OF ADMISSION:  09/09/2007  DATE OF DISCHARGE:  09/17/2007                               DISCHARGE SUMMARY   DISCHARGE DIAGNOSES:  1. Status post ventilator-dependent respiratory failure secondary to      right lower lobe pneumonia, exacerbation of chronic obstructive      pulmonary disease, and atelectasis.  2. Severe sepsis secondary to Klebsiella pneumoniae bacteremia.  3. Delirium.  4. Hyperglycemia.  5. Dysphagia.  6. Debility.   PROCEDURES:  1. Endotracheal tube placed September 03, 2007, removed September 10, 2007.  2. Foley catheter placed August 28, 2007, removed September 16, 2007.  3. Left upper PICC line placed August 30, 2007, removed by patient on      September 12, 2007.  4. Right subclavian triple lumen catheter placed August 29, 2007,      removed September 09, 2007.  5. Right radial arterial line catheter placed August 30, 2007, removed      September 09, 2007.  6. Feeding tube [facility]) placed September 09, 2007, removed September 11, 2007.  7. Cultures date August 28, 2007.  Blood cultures x2 demonstrating      Klebsiella pneumonia.  8. Respiratory culture August 29, 2007 - no growth.  9. September 09, 2007 bronchioalveolar lavage 10,000 colony count,      Candida, presumed contaminant.   ANTIBIOTICS:  1,  Rocephin from October 4 to September 11, 2007.  1. Azithromycin from August 28, 2007 to September 09, 2007.  2. Vancomycin August 29, 2007 to September 09, 2007.   ICU protocols, hyperglycemia protocol, acute respiratory distress  syndrome protocol for mechanical ventilation on October 7 through  October 17 and also sedation protocol from October 6 to October 17.   BRIEF HISTORY:  This is an 77 year old white  female with a history of  asthma, diabetes type 2, chronic obstructive pulmonary disease,  hypertension, who presented to [facility] on August 28, 2007  with a right lower lobe pneumonia.  She developed severe sepsis/septic  shock/acute respiratory distress syndrome and was intubated on August 29, 2007, and had eventually been weaned off vasopressor support on  September 01, 2007.  She had been ventilated on low-tidal volume  ventilation.  Sepsis had resolved.  However, she was transferred to  [facility] secondary to no critical care coverage at [facility].   HOSPITAL COURSE:  1. Status post ventilator-dependent respiratory failure (resolved) in      the setting of right lower lobe pneumonia and acute respiratory      distress syndrome, created by underlying chronic obstructive      pulmonary disease.  [name] was, as previously mentioned, placed      on mechanical ventilation on August 29, 2007 and ventilated with a      lung protective  motive of ventilation at 6 mL/kg predicted body      weight.  She was transferred to [facility] for critical care      coverage from [facility].  She continued to make slow      progress during the course of her hospitalization.  She was      transferred on October 16, and was actually placed on spontaneous      breathing trial and successfully extubated on September 10, 2007 the      following day after transfer to [facility].  She has had      no further sequelae from a pulmonary standpoint.  She was treated      empirically with antibiotics consisting of Rocephin from August 28, 2007 to September 11, 2007, as well as azithromycin from August 28, 2007 to September 09, 2007 and vancomycin from August 29, 2007 to      September 09, 2007.  Of note, culture data from sputum samples have      all been negative.  Upon time of discharge, she no longer has      purulent cough, leukocytosis, or fever, but does exhibit  risk for      repeat respiratory failure in the setting of severe dysphagia and      aspiration risk (see section below).  2. Exacerbation of chronic obstructive pulmonary disease.  This      certainly complicated her respiratory mechanics while on mechanical      ventilation.  [name] has been supported on scheduled      bronchodilators and supplemental oxygen.  She will no doubt be      oxygen-dependent in the outpatient setting.  She is currently      completing a prednisone taper.  3. Klebsiella pneumoniae bacteria.  As previously mentioned, Ms.      [name] was at [facility] and treated for Klebsiella      pneumoniae bacteremia.  She completed abovementioned antibiotic      course.  She had required aggressive volume resuscitation and      vasopressor support.  Her sepsis syndrome is now resolved and she      has been off antibiotics.  4. Severe dysphagia.  This was identified by modified barium swallow      and evaluated by speech therapy.  Upon time of discharge, her      current recommendations are for her to have pudding thick liquids      and to follow a dysphagia 1 diet.  She remains a high aspiration      risk in the setting of delirium and underlying severe lung disease.  5. Delirium.  [name] continues to exhibit significant delirium.      She is confused, however, awake and able to follow commands.  This      has slowly improved; however, she is certainly not at baseline at      this point.  Therefore, we will continue Seroquel support.  This      may require further psychological evaluation/clinical psych      followup in the outpatient setting.  6. Hyperglycemia.  This was exacerbated by supportive IV systemic      steroids in the ICU setting.  This has improved and she is      currently off insulin support.  7. Severe debility.  [name] is profoundly weak following prolong  critical illness.  It is currently the recommendation of physical       therapy and occupational therapy services that [name] be      admitted to a skilled nursing facility for rehabilitation services.   DISCHARGE INSTRUCTIONS:  1. Diet.  Dysphagia 1 diet with pudding-thick liquids.  She is to      follow strict aspiration precautions with full supervision during      all meals.  She will certainly need further speech therapy      followup.  2. Activity.  Cleared for physical therapy and occupational therapy at      the discretion of accepting attending physician.   ALLERGIES:  SULFA.   MEDICATIONS:  1. Lovenox 40 mg subcu every 24 hours.  2. Ensure pudding 113 mL p.o. t.i.d.  3. Folic acid 1 mg p.o. daily.  4. Atrovent 0.5 mg inhaled q.i.d.  5. Xopenex 0.63 mg inhaled q.i.d.  6. Maxitrol ophthalmic suspension 2 drops o.p. q.i.d.  7. Ditropan 10 mg p.o. daily.  8. Protonix 40 mg p.o. daily.  9. Prednisone 10 mg p.o. daily.  10.Seroquel 25 mg p.o. b.i.d.  11.Thiamine 100 mg p.o. daily.  12.Tylenol 650 mg p.o. q.6h. p.r.n. pain.  13.Thicket instant food thickener to be given to provide pudding-thick      consistency with all liquids.   FOLLOW UP:  [name] will need outpatient followup with her primary  care Dov Dill at the time of discharge from skilled nursing facility.  She had been followed by Dr. Avon Gully at [facility] prior  to transfer to [facility], also seen by Dr. Kari Baars with  Pulmonary.      Zenia Resides, NP      Charlcie Cradle. Delford Field, MD, [facility]  Electronically Signed    PB/MEDQ  D:  09/16/2007  T:  09/16/2007  Job:  [id number]   cc:   Tesfaye D. Felecia Shelling, MD  Oneal Deputy [name], M.D.

## 2011-04-08 NOTE — Group Therapy Note (Signed)
[name], [name]              ACCOUNT NO.:  [id number]   MEDICAL RECORD NO.:  [id number]          PATIENT TYPE:  INP   LOCATION:  IC04                          FACILITY:  [facility]   PHYSICIAN:  Edward L. [name], M.D.DATE OF BIRTH:  [birth year]   DATE OF PROCEDURE:  DATE OF DISCHARGE:                                 PROGRESS NOTE   [name] is overall, I think, about the same.  Her labs are pending  this morning.   Her blood pressure 123/62, heart rate 76, O2 sats 94%, CVP is 12.  Her chest is a little clearer.  She, I think, overall looks slightly better.  However, all of her labs  are pending, including blood gas, BMET, and her chest x-ray.   We will continue with the ARDS protocol.  Continue antibiotics, et  Karie Soda.  Her blood sugar has been better so I am happy with that.  Blood  pressure is stable.  I think overall she is improving.      Edward L. [name], M.D.  Electronically Signed     ELH/MEDQ  D:  09/04/2007  T:  09/04/2007  Job:  [id number]

## 2011-04-08 NOTE — Group Therapy Note (Signed)
[name], [name]              ACCOUNT NO.:  [id number]   MEDICAL RECORD NO.:  [id number]          PATIENT TYPE:  INP   LOCATION:  IC04                          FACILITY:  [facility]   PHYSICIAN:  Edward L. [name], M.D.DATE OF BIRTH:  [birth year]   DATE OF PROCEDURE:  09/05/2007  DATE OF DISCHARGE:                                 PROGRESS NOTE   Ms. Dinse is I think better this morning.  She actually was down on 50%  for a while, then she desaturated.  She is back up on her oxygen, but  overall I think she is doing much better.   PHYSICAL EXAMINATION:  VITAL SIGNS  Heart rate is in the 80s, blood  pressure 135/59.  NEUROLOGIC:  She is sedated but intact.  CHEST:  Clearing.  HEART:  Regular, without gallop.  EXTREMITIES:  Showed no edema now.   Her blood gas, this and was on 50%, 350, rate is 17, 8 of PEEP:  pH  7.47, PCO2 of 49, PO2 of 76, but since then she has had to be back up on  her FIO2.  White blood count is continuing to climb; it is now 25,000.  I am not sure what has happened with that, but her hemoglobin is holding  at 11.7, platelets 226.  Complete metabolic profile:  BUN 34, glucose  158, CO2 of 36, chloride is 93.  Overall, I think she is much improved.  Her chest x-ray continues to show improvement as well.   ASSESSMENT:  She has adult respiratory distress syndrome.  She is being  treated, and she is improving slowly.  My plan, then, is to continue  with her current treatments medications.  Her albumin level has come up,  and I am fairly happy in general with how she is doing.  She does seem  to be improving with time.      Edward L. [name], M.D.  Electronically Signed     ELH/MEDQ  D:  09/05/2007  T:  09/06/2007  Job:  [id number]

## 2011-04-08 NOTE — Consult Note (Signed)
NAMEJAYLEENA, STILLE              ACCOUNT NO.:  [id number]   MEDICAL RECORD NO.:  [id number]          PATIENT TYPE:  INP   LOCATION:  IC04                          FACILITY:  [facility]   PHYSICIAN:  Edward L. [name], M.D.DATE OF BIRTH:  1923/08/30   DATE OF CONSULTATION:  08/29/2007  DATE OF DISCHARGE:                                 CONSULTATION   Ms. Bansal is an 77 year old with a history of asthma and bronchitis,  hypertension and diabetes.  She came to the hospital with cough,  congestion, shortness of breath; and was thought to have an asthma  exacerbation, but had a right lower lobe pneumonia.  After she was  admitted to the hospital she became increasingly short of breath, and  ended up having to be  intubated and taken to the intensive care unit.  Prior to admission she had been using her inhaler frequently and had  been having fever and cough.  Her white blood count was about 30,000.   PAST MEDICAL HISTORY:  In addition to the asthma, is positive for:  1. Gastroesophageal reflux disease.  2. Diabetes mellitus.  3. Hypertension.  4. History of hypokalemia.  5. History of multiple falls.   MEDICATIONS AT HOME:  1. Oxybutynin 10 mg daily.  2. Vicodin 5/500 one 3x a day as needed.  3. Lasix 20 mg daily.  4. Nexium 40 mg daily.  5. Albuterol by nebulizer q.4 h as needed.   She had a pelvic fracture in 2006.   SOCIAL HISTORY:  She has been living at home alone.  She does have  family in the area.  She does not smoke cigarettes, drink any alcohol or  use any illicit drugs.   FAMILY HISTORY:  Is not listed in the old chart and currently not  obtainable.   PHYSICAL EXAMINATION:  Shows she is intubated.  Her blood pressure was  in the 90s, pulse in the 80s, O2 saturations in the 90s.  Her chest  shows rhonchi bilaterally, with some rales on the right.  Her heart is  regular without gallop.  Her abdomen was soft without masses.  She does  not have any organomegaly.  She does  not have any edema.   LABORATORY WORK:  (This morning)  White blood count was 30,000 yesterday  and is now down to 23,400 with a platelet count of 185, hemoglobin of  12.  Her BMET shows a sodium 133, potassium 3.5, CO2 of 17, glucose 191,  BUN 32, creatinine 1.6.  She has a positive blood culture with gram-  negative rods.  On 90% O2, 600 tidal volume, rate of 14, 5 of PEEP, pH  7.35, pCO2 of 33, pO2 of 55.   CHEST X-RAY:  Shows that the endotracheal tube was close to the carina,  so it will need to be retracted a bit.   ASSESSMENT:  She has respiratory failure on the basis of pneumonia.  She  also is septic.  Therefore, I think, clearly by definition, she is  septic.  I have asked for Dr. Lovell Sheehan to go ahead and place a central  line.  We are going to initiate the sepsis protocol, and we will plan to  go ahead with vancomycin in addition to her other antibiotics; which  currently are Zithromax and Rocephin.  Since she has already improved  her white blood cell count etc., hopefully she will have a good result;  but she is very hypoxic (even on 90% oxygen).  Perhaps pulling the  endotracheal back will make a difference as well.      Edward L. [name], M.D.  Electronically Signed     ELH/MEDQ  D:  08/29/2007  T:  08/29/2007  Job:  [id number]

## 2011-04-08 NOTE — Group Therapy Note (Signed)
[name], [name]              ACCOUNT NO.:  [id number]   MEDICAL RECORD NO.:  [id number]          PATIENT TYPE:  INP   LOCATION:  IC04                          FACILITY:  [facility]   PHYSICIAN:  Edward L. [name], M.D.DATE OF BIRTH:  [birth year]   DATE OF PROCEDURE:  DATE OF DISCHARGE:                                 PROGRESS NOTE   Patient of Dr. Janna Arch.  [name] is overall, I think, about the  same.  She is a little more arousable this morning.  Chest x-ray done  about midnight is better.  Her blood pressure now 123/57 from the art  line.  Her heart rate 69.  She is afebrile.  Temperature now 37.1 her  weight 92.9 was 92.7 yesterday.  Her I&O on the 14th was +1016 so far  today +112.  Electrolytes sodium 143, potassium 3.9, chloride 107, CO2  of 31, BUN 33, creatinine 0.66.  Her blood gas on 50% shows pH 7.45,  pCO2 of 42, pO2 of 76.  She had been on 40% and 5 of PEEP.  Her white  blood count this morning is 21,200, hemoglobin is 10.8, platelets  256,000.  Metabolic profile shows that her albumin is down to 2.5.   ASSESSMENT:  She is I think overall somewhat better.   PLAN:  We are going to see again if we can get her O2 sat up.  If so, we  will try to get her on 40% perhaps 5 of PEEP and see if she can do  anything toward extubation, although I do not expect to be able to be  extubated today.  She continues having episodes of hypoxia at night of  unclear etiology.      Edward L. [name], M.D.  Electronically Signed     ELH/MEDQ  D:  09/08/2007  T:  09/08/2007  Job:  [id number]

## 2011-04-08 NOTE — Group Therapy Note (Signed)
NAMEJIAH, BARI              ACCOUNT NO.:  [id number]   MEDICAL RECORD NO.:  [id number]          PATIENT TYPE:  INP   LOCATION:  IC04                          FACILITY:  [facility]   PHYSICIAN:  Edward L. [name], M.D.DATE OF BIRTH:  1923-02-15   DATE OF PROCEDURE:  09/06/2007  DATE OF DISCHARGE:                                 PROGRESS NOTE   Ms. Mckenna has continued improvement.  She has not had a bowel movement  without we are going to get her a suppository to help with that, she has  some thrush in her groin so we are going to help her with that.  Otherwise everything seems to be going fairly well.   EXAMINATION:  Shows pulse is 84, blood pressure 125/56, O2 sats 94%.  CHEST:  Clearing, she still has some rales.  HEART:  Regular.  ABDOMEN:  Soft.  She does not have any edema.   Blood gas this morning on 50% 350, rate of 17, 8 of PEEP, PO2 is 80,  PCO2 of 44, pH 7.49.  CBC shows white count is 28,500, hemoglobin 11.9,  platelets 242.  BMET:  BUN is 32, creatinine 0.73, calcium 7.3, CO2 is  36.   ASSESSMENT:  She remains on the sepsis protocol although she is much  improved.  She remains on the adult respiratory distress syndrome  protocol, also much improved.  She has respiratory failure, she is  diabetic.  Her blood sugars been much better.  Her albumin has been low,  it had come up.  Chest x-ray generally looks better.  Her exam is  improving.   PLAN:  Try to get her a suppository and see if we can get her some  Mycostatin, etc., for her skin.      Edward L. [name], M.D.  Electronically Signed     ELH/MEDQ  D:  09/06/2007  T:  09/06/2007  Job:  [id number]

## 2011-04-08 NOTE — Group Therapy Note (Signed)
[name], [name]              ACCOUNT NO.:  [id number]   MEDICAL RECORD NO.:  [id number]          PATIENT TYPE:  INP   LOCATION:  IC04                          FACILITY:  [facility]   PHYSICIAN:  Edward L. [name], M.D.DATE OF BIRTH:  Mar 18, 1923   DATE OF PROCEDURE:  08/31/2007  DATE OF DISCHARGE:                                 PROGRESS NOTE   [name] remains intubated and on the ventilator.  She had a fairly  restful night apparently.  She remains on Rocephin, vancomycin.  She is  on DVT prophylaxis, she is on the stress ulcer prophylaxis.   EXAM:  This morning shows her blood pressure by art line 116/59, pulse  84, she is afebrile now, temperature is 36.4.  Her I and O +3746 on the  fifth, 3450 on the sixth, 1100 so far on the 7th, and her weight is now  97.8 kg compared to 92.1.  CHEST:  Shows more rhonchi than before.  HEART:  Regular.  ABDOMEN:  Soft.  She does have some peripheral edema.   Chest x-ray looks like she has got considerable fluid in her chest now.  Her laboratory work:  White count now 19,000, hemoglobin 11.8, PT/INR  19.4 and 1.6 respectively, potassium is 3.5, BUN 27, creatinine 0.9,  blood gas shows a pH 7.5, PCO2 of 33, PO2 of 73.   ASSESSMENT:  She has pneumonia.  She now looks like she has got  significant volume overload.  Her weight has gone up, she is very  positive I & O and her chest x-ray reflects that as well.  She is on  antibiotics.  She is on otherwise appropriate treatment on the sepsis  protocol and I think now it is time to go ahead and back down on her IV  fluids and see about trying to get her some diuretics, see if we can  diurese her somewhat and follow.      Edward L. [name], M.D.  Electronically Signed     ELH/MEDQ  D:  08/31/2007  T:  08/31/2007  Job:  [id number]

## 2011-04-10 ENCOUNTER — Other Ambulatory Visit: Payer: Self-pay | Admitting: Urology

## 2011-04-10 DIAGNOSIS — N200 Calculus of kidney: Secondary | ICD-10-CM

## 2011-04-11 NOTE — H&P (Signed)
NAMEJOSIE, [name]              ACCOUNT NO.:  [id number]   MEDICAL RECORD NO.:  [id number]          PATIENT TYPE:  INP   LOCATION:  A318                          FACILITY:  [facility]   PHYSICIAN:  Kingsley Callander. Ouida Sills, MD       DATE OF BIRTH:  [birth year]   DATE OF ADMISSION:  07/29/2005  DATE OF DISCHARGE:  LH                                HISTORY & PHYSICAL   CHIEF COMPLAINT:  Left hip pain with inability to walk.   HISTORY OF PRESENT ILLNESS:  This patient is an 77 year old white female who  fell at home one day prior to admission. She was unable to get up. She  remained on the floor for about 18 hours before calling the ambulance. She  had been unable to get off the floor and walk due to pain in her left hip  area. She was evaluated in the emergency room and was found to have a left  parasymphyseal pelvic fracture. She also sustained a large bruise with a  laceration to her left elbow. She has no prior history of fracture.   PAST MEDICAL HISTORY:  1.  Hiatal hernia repair.  2.  Appendectomy.  3.  Left ventricular hypertrophy.   MEDICATIONS:  Nexium 40 mg daily.   ALLERGIES:  SULFA.   SOCIAL HISTORY:  She does not smoke or drink. She lives at home alone. She  drives.   REVIEW OF SYSTEMS:  She is uncertain as to whether she experienced any  definite loss of consciousness. She had been preparing to sit down when she  fell. No chest pain, shortness of breath, difficulty voiding, or change in  bowel habits. Stools are chronically somewhat loose she states.   PHYSICAL EXAMINATION:  GENERAL:  She is normotension. Heart rate is 90.  HEENT:  The head is atraumatic and normocephalic. No scleral icterus is  present. Oropharynx is unremarkable.  NECK:  Supple with no thyromegaly or lymphadenopathy.  LUNGS:  Clear.  HEART:  Regular with no murmurs.  ABDOMEN:  Nontender. No hepatosplenomegaly.  EXTREMITIES:  She has pain with flexion and extension of the left hip. She  has difficulty  raising the left leg off of the bed. No cyanosis, clubbing,  cyanosis, or edema. There is a large area of bruising with a shallow  laceration on the left elbow.  NEUROLOGICAL:  She is oriented to the month, year, and place. Her strength  is intact throughout although she has pain with movement of the left leg.   LABORATORY DATA:  See printout. EKG reveals normal sinus rhythm at 90 beats  per minute with a leftward axis, low voltage in the frontal leads and  nonspecific T-wave changes. Her head CT reveals no acute abnormality. X-rays  of her left elbow reveal no fracture. X-rays of her left hip and pelvis  reveal no other fractures besides the parasymphyseal fracture on the left.   IMPRESSION:  1.  Left parasymphyseal fracture. She is unable to walk. She will require      hospitalization. Will consult the orthopedist and physical therapist.  2.  Left elbow  contusion with shallow laceration. Update tetanus status.  3.  History of hiatal hernia surgery and gastroesophageal reflux disease.      Will continue a protein pump inhibitor.      Kingsley Callander. Ouida Sills, MD  Electronically Signed     ROF/MEDQ  D:  07/30/2005  T:  07/30/2005  Job:  [id number]

## 2011-04-11 NOTE — Op Note (Signed)
[name], [name]                        ACCOUNT NO.:  [id number]   MEDICAL RECORD NO.:  [id number]                   PATIENT TYPE:  AMB   LOCATION:  DAY                                  FACILITY:  [facility]   PHYSICIAN:  Marlane Hatcher, M.D.           DATE OF BIRTH:  [birth year]   DATE OF PROCEDURE:  06/29/2002  DATE OF DISCHARGE:  06/29/2002                                 OPERATIVE REPORT   PREOPERATIVE DIAGNOSES:  1. Lesion right shoulder.  2. Palpable mass in right breast, nondiagnostic mammogram.   POSTOPERATIVE DIAGNOSES:  1. Lesion right shoulder.  2. Palpable mass in right breast, nondiagnostic mammogram.   PROCEDURE:  1. Excisional biopsy of mass in right shoulder.  2. Right partial mastectomy.   SURGEON:  Marlane Hatcher, M.D.   INDICATIONS FOR PROCEDURE:  This is a 77 year old white female who presented  to the office with a palpable mass in the outer mid-to-lower quadrant of the  right breast.  No other lesions were palpated.  No axillary adenopathy was  appreciated and there were no other satellite lesions. She also had a lesion  which appeared to be an epidermal inclusion cyst on her right shoulder.  We  planned to biopsy both of these as an outpatient.  We discussed the  procedure in detail including bleeding, infection, and the possibility that  more surgery may be required.  Informed consent was obtained.   GROSS OPERATIVE FINDINGS:  Right shoulder lesion was consistent clinically  as an epidermoid inclusion cyst.  The mass in the right breast appeared to  be possibly fat necrosis or a thickened lipoma of the breast.   OPERATIVE TECHNIQUE:  The patient was placed in the supine position after  adequate sedation and using 1% Xylocaine with epinephrine, the area of the  right shoulder which had been previously marked, by a marking pen, was  anesthetized and we performed excisional biopsy of a lesion there and sent  this for permanent section.  The  wound was irrigated with normal saline and  the skin was approximated with 4-0 Nylon and interrupted vertical mattress  sutures.   Attention was then turned to the right breast which was also marked with a  sterile marking pen.  The skin, subcutaneous tissue and the breast tissue  was excised using 1 Xylocaine without epinephrine as the local anesthetic.  The palpable lesion was then removed and sent for a permanent section.  Bleeding was controlled with a cautery device and with ligating with 2-0  Polysorb suture.  The wound was irrigated with sterile water; and the breast  tissue was approximated with 3-0 Polysorb; and the skin was closed with a  subcuticular 5-0 Polysorb suture.  Steri-Strips were applied to all wounds  with a sterile dressing with Neosporin applied.   Prior to closure, all sponge, needle, and instrument counts were found to be  correct.  Estimated blood loss was  minimal.  The patient received 100 cc of  crystalloids intraoperatively.  No drains were placed and there were no  complications.                                               Marlane Hatcher, M.D.    WSB/MEDQ  D:  06/29/2002  T:  07/02/2002  Job:  [id number]

## 2011-04-11 NOTE — Group Therapy Note (Signed)
NAMEJINNIE, [name]              ACCOUNT NO.:  [id number]   MEDICAL RECORD NO.:  [id number]          PATIENT TYPE:  ORB   LOCATION:  S141                          FACILITY:  [facility]   PHYSICIAN:  Hanley Hays. Dechurch, M.D.DATE OF BIRTH:  1922-12-18   DATE OF PROCEDURE:  09/12/2005  DATE OF DISCHARGE:                                   PROGRESS NOTE   HISTORY OF PRESENT ILLNESS:  The reader is referred to my History and  Physical and Discharge Summary from August 04, 2005.  The patient was  hospitalized for six days secondary to a left parasymphyseal fracture that  was a result of a fall.  She was actually discharged to [facility] for short term rehabilitation, and was discharged from there after  about a week of rehab where she then went home, but it was found that she  was unable to take care of herself and requested to be admitted to our  facility.  She felt she would be able to manage at home.  However, after a  brief time at home, she found that that was not going to fly.  In any event,  she was admitted to the [facility] on August 27, 2005.   PAST MEDICAL HISTORY:  1.  Multiple falls.  2.  Hiatal hernia repair which was complicated, though I do not have the      results.  She had a long-term hospital stay.  3.  Status post appendectomy.  4.  History of left ventricular hypertrophy.  5.  She was essentially sedentary at home according to her family.  However,      she was able to live independently.  6.  History of asthma on home nebulizers.  7.  History of urinary frequency and incontinence.  8.  Remote history of bladder suspension repair.  9.  History of hypokalemia.  10. History of hyperglycemia.  11. History of hematuria.  12. Gastroesophageal reflux.  13. History of complicated hiatal hernia repair which resulted in a      prolonged hospitalization.   MEDICATIONS:  1.  Paxil CR 30 mg daily.  2.  Ditropan XL 10 mg daily.  3.  Claritin 10 mg daily.  4.  Celebrex 200 mg daily.  5.  Lasix 20 mg daily.  6.  Nexium 40 mg daily.  7.  Flovent two puffs b.i.d.  8.  Albuterol nebulizer b.i.d.  9.  Ambien 5 mg q.h.s. p.r.n. sleep.  10. Xanax 0.25 mg q.6h. p.r.n. anxiety.  11. Vicodin 5/500 q.4 h p.r.n. pain.   ALLERGIES:  SULFA AND APPARENTLY DID NOT TOLERATE CORTISONE.   REVIEW OF SYSTEMS:  She initially had a significant amount of problem with  pain, and still is complaining about some aching with therapy, but has done  much better.  She is walking with a walker independently.  She has had no  further falls.  Her p.o. intake is good.  She denies any GI or GU  complaints.   SOCIAL HISTORY:  She lives independently.  She has family who is supportive  and  present.  The patient has no history of tobacco abuse.  She apparently  does drink some alcohol according to the family, but the patient does not  offer this information.  She has not truly had any problems with abuse.  They are not sure if it contributed to her falls.   FAMILY HISTORY:  Noncontributory.   PHYSICAL EXAMINATION:  GENERAL APPEARANCE:  Well-developed, well-nourished,  appropriate female who is moderately overweight who denies any complaints at  present.  Able to provide history.  NECK:  Supple.  LUNGS:  Clear to auscultation.  Not diminished.  No rales or rhonchi are  noted.  HEART:  Regular without murmur.  ABDOMEN:  Obese, soft and nontender.  EXTREMITIES:  No clubbing, cyanosis.  She has some mild venostasis with  trace edema.  NEUROLOGICAL:  Completely intact.  Alert and oriented to time and place.  She tends to repeat herself at times, but for the most part is appropriate.   ASSESSMENT/PLAN:  Status post left parasymphyseal fracture post multiple  falls.  Here for rehabilitation.  Has done well.  Hopefully, she will be  discharged to home with the support of her family.  No change will be made  in her medical regimen at this time.  She will follow up with Dr.   Janna Arch  as an outpatient upon discharge.      Hanley Hays Josefine Class, M.D.  Electronically Signed     FED/MEDQ  D:  09/12/2005  T:  09/13/2005  Job:  [id number]

## 2011-04-11 NOTE — Group Therapy Note (Signed)
NAMEGLEN, BLATCHLEY              ACCOUNT NO.:  [id number]   MEDICAL RECORD NO.:  [id number]          PATIENT TYPE:  ORB   LOCATION:  S141                          FACILITY:  [facility]   PHYSICIAN:  Hanley Hays. Dechurch, M.D.DATE OF BIRTH:  01-05-1923   DATE OF PROCEDURE:  09/17/2005  DATE OF DISCHARGE:                                   PROGRESS NOTE   HISTORY OF PRESENT ILLNESS:  Mrs.  Colantonio is doing extremely well.  She is  ambulating about the facility with her walker.  Her gait is stable.  Her  spirits are good.  She notes shortness of breath with exertion, but no  different from her baseline status.  She denies any pain.  She states she is  ready to go home.  In deed, it seems that she has benefited from short term  rehab.  I will discuss with discharge planning regarding discharge date.  I  am not going to make any changes in her regimen at this time.  She does note  some fatigue which in part may be related to her COPD, and this can be  further evaluated and monitored as an outpatient.      Hanley Hays Josefine Class, M.D.  Electronically Signed     FED/MEDQ  D:  09/17/2005  T:  09/18/2005  Job:  [id number]

## 2011-04-11 NOTE — Procedures (Signed)
NAMEJOLIET, MALLOZZI              ACCOUNT NO.:  [id number]   MEDICAL RECORD NO.:  [id number]          PATIENT TYPE:  INP   LOCATION:  A318                          FACILITY:  [facility]   PHYSICIAN:  Vida Roller, M.D.   DATE OF BIRTH:  September 08, 1923   DATE OF PROCEDURE:  DATE OF DISCHARGE:                                  ECHOCARDIOGRAM   PRIMARY CARE PHYSICIAN:  Hanley Hays. Josefine Class, M.D.   TAPE NUMBER:  [id number].   TAPE COUNT:  Is 0 through 482.   HISTORY:  This is an 77 year old woman with an episode of near syncope and  what appears to be a hip fracture.   TECHNICAL QUALITY:  Of the study is extremely poor.  M-mode tracings are  inaccurate.   Overall:  1.  The left ventricular systolic function appears to be preserved at 65-      70%.  2.  There is significant mitral anular calcification but no obvious mitral      stenosis.  3.  By Doppler profile, there is no clear evidence of aortic stenosis      either.  4.  There is no pericardial effusion.  5.  Her right ventricular size appears to be enlarged but these are      nonstandard views and so this is difficult to assess.      Vida Roller, M.D.  Electronically Signed     JH/MEDQ  D:  08/01/2005  T:  08/01/2005  Job:  [id number]

## 2011-04-11 NOTE — Discharge Summary (Signed)
[name], [name]              ACCOUNT NO.:  [id number]   MEDICAL RECORD NO.:  [id number]          PATIENT TYPE:  INP   LOCATION:  6705                         FACILITY:  [facility]   PHYSICIAN:  Melvyn Novas, MDDATE OF BIRTH:  Jan 10, 1923   DATE OF ADMISSION:  09/09/2007  DATE OF DISCHARGE:  10/31/2008LH                               DISCHARGE SUMMARY   The patient is an 77 year old white female with COPD, hypertension,  chronic degenerative joint disease, asthmatic bronchitis,  hyperlipidemia.  Essentially the patient was admitted with pneumonia.  The patient had gram-negative septicemia and hypotension.  Was  subsequently intubated, had hypoxia.  Was started on Rocephin and  Zithromax.  A pulmonary consultation was obtained.  The patient had a  tenuous course hemodynamically throughout the course of her stay.  Subsequent cultures of the sputum grew out Klebsiella pneumonia.  Vancomycin IV was added according to protocol.  She continued to improve  gradually, however, was not able to be weaned.  She had hypoproteinemia,  decreased plasma oncotic pressure, white count of 20 to 30 thousand,  which was improving, and we gradually weaned down her FiO2 but could not  extubate her.  She was subsequently transferred to [facility],  room 2105, for further chronic care and more diligent monitoring of  ventilatory weaning parameters.      Melvyn Novas, MD  Electronically Signed     RMD/MEDQ  D:  12/09/2007  T:  12/09/2007  Job:  [id number]

## 2011-04-11 NOTE — Cardiovascular Report (Signed)
[place]  Patient:    [name], [name]                     MRN: [id number] Proc. Date: 01/08/01 Adm. Date:  [id number] Disc. Date: [id number] Attending:  Ronaldo Miyamoto CC:         Doylene Canning. Ladona Ridgel, M.D. [facility]  Damaris Schooner, M.D.  CV Laboratory   Cardiac Catheterization  INDICATIONS:  [name] is a 77 year old who has had chest discomfort and exertional shortness of breath.  The current study was done to access coronary anatomy and right heart pressures.  She was seen in consultation by Dr. Lewayne Bunting.  PROCEDURES: 1. Right and left heart catheterization. 2. Selective coronary arteriography. 3. Selective left ventriculography.  DESCRIPTION OF PROCEDURE:  The procedure was performed from the right femoral artery using 6 French catheters.  She tolerated the procedure without complication.  Right heart catheterization was performed through the right femoral vein using a 8 French sheath and 7.5 [place] double lumen thermodilution Swan-Ganz catheter.  Saturations were taken from the superior vena cava and the pulmonary artery.  There were no complications from the procedure.  Intracoronary nitroglycerin was given during the right coronary injection.  HEMODYNAMICS: 1. Right atrial pressure 6. 2. Right ventricle 27/7. 3. Pulmonary artery 28/16. 4. Pulmonary capillary wedge 7. 5. Aortic 144/74. 6. LV 136/11.  SATURATIONS: 1. Superior vena cava 66%. 2. Pulmonary artery 69%. 3. Aortic root 92%. 4. Cardiac outputs:  Fick cardiac output 6.4 L/min., index 3.2 L/min. per    sq m.  ANGIOGRAPHIC DATA:  VENTRICULOGRAPHY:  Ventriculography in the RAO projection reveals vigorous global systolic function.  Ejection fraction was 68%.  The left main coronary artery was free of critical disease.  The LAD coursed to the apex.  The LAD has minor luminal irregularities but no high-grade stenoses.  There was perhaps 30% narrowing in the more  proximal portion of the LAD.  There is a diagonal branch that had about a 50-60% area of mid narrowing.  However, this was a relatively small caliber vessel.  The circumflex provided two marginal branches.  At the ostium there was some eccentric plaquing that appeared at most to be 30%.  It did not appear to be high grade.  The right coronary artery was a dominant vessel.  There were tandem stenoses of 30% in the proximal and mid vessel.  These do not appear to be high-grade and were relatively smooth.  CONCLUSIONS: 1. Preserved overall left ventricular function. 2. Scattered coronary arterial disease that does not appear to be    critical, with the worst being a diagonal stenosis of 50-60%. 3. Normal right heart pressures.  DISPOSITION:  Followup with Dr. Ladona Ridgel.  Final disposition by Dr. Ladona Ridgel. DD:  01/08/01 TD:  01/09/01 Job: [id number]

## 2011-04-11 NOTE — Discharge Summary (Signed)
NAMEDEMISHA, NOKES              ACCOUNT NO.:  [id number]   MEDICAL RECORD NO.:  [id number]          PATIENT TYPE:  INP   LOCATION:  A318                          FACILITY:  [facility]   PHYSICIAN:  Hanley Hays. Dechurch, M.D.DATE OF BIRTH:  1923-09-03   DATE OF ADMISSION:  07/29/2005  DATE OF DISCHARGE:  09/11/2006LH                                 DISCHARGE SUMMARY   DIAGNOSES:  1.  Left parasymphyseal fracture.  2.  History of multiple falls.  3.  Left elbow contusion.  4.  Asthma.  5.  Gastroesophageal reflux with a history of hiatal hernia repair with      reported complicated postoperative course.  6.  History of left ventricular hypertrophy.  7.  Urinary frequency.  8.  Hypokalemia.  9.  Hyperglycemia  10. Hematuria.   DISPOSITION:  The patient is being discharged to a skilled nursing facility  for short-term rehab.   DISCHARGE MEDICATIONS:  1.  Bactroban to left elbow Band-Aid daily.  2.  Claritin daily.  3.  Protonix 80 <mg daily or Nexium 40 mg daily.  4.  Paxil 30 mg daily.  5.  Flovent two puffs b.i.d.(110 mcg).  6.  Celebrex 200 mg daily for one week then p.r.n. pain.  7.  Xanax 0.25 mg q.6h. p.r.n. anxiety.  8.  Lortab 5/500 q.4h. p.r.n. pain.  9.  Ventolin albuterol nebulizers q.4h. p.r.n. increased wheezing.  10. Combivent two puffs t.i.d.  11. Ditropan XL 10 mg.   DISCHARGE INSTRUCTIONS:  Follow-up BMP in one week. She will need a follow-  up UA to ensure resolution of hematuria in one month.   HOSPITAL COURSE:  The patient is a 77 year old Caucasian female who lives  independently who has had several falls according to the family over the  last couple of weeks.  They thought it was in association with her high  blood pressure medications as she has not been taking them.  She fell on the  day of admission and apparently was unable to get and was found on the floor  possibly being down as long as 18 hours.  She was a little confused in the  emergency room  but this has resolved.  CT of the brain was unremarkable  except for some atrophy.  Pelvic films revealed the left parasymphyseal  fracture without widening.  She was seen in consultation by orthopedist who  recommended weightbearing as tolerated.  She has a long-standing history of  asthma for which she uses nebulizers twice daily at home and Flovent.  This  was reinstituted.  Her wheezing improved.  She had a contusion over left  elbow which is resolving and near healed at the time of discharge.  Because  of the pain she was placed on Celebrex which we will continue for the week  and then change to p.r.n.  She seemed to tolerate this well.  The family was  concerned that the patient had had some alcohol use at home but there was no  evidence of withdrawal here in the hospital.  She also has a history of  anxiety disorder  for which she is on Paxil and tolerated this well.  Her  medications at home included Nexium, Paxil 20 daily, Ditropan XL daily,  Meclizine 12.5 t.i.d. and aspirin 81 daily and Restoril p.r.n. At the day of  discharge she is complaining of nocturia and urinary frequency. Her Ditropan  has been held during the hospital stay but this can be reinstituted. She did  note some anxiety initially and also noted that she would occasionally drink  alcohol in order to help her anxiety.  Her Paxil was increased and her  symptoms actually improved.  At the time of discharge she is working with  physical therapy.  She states her pain is much better.  She still has some  pain with initial standing and ambulation.  She is sleeping well.  Her  mental status has remained intact and asthma has also been well-controlled.   PHYSICAL EXAMINATION:  GENERAL: This is a pleasant female, alert  appropriate.  VITAL SIGNS:  Blood pressure is 120/70.  There was no hypertension noted  during the hospital stay.  NECK:  Supple and there is no JVD.  HEENT:  Her oropharynx is moist.  LUNGS:  Clear to  auscultation.  ABDOMEN:  She is somewhat obese, abdomen is soft and nontender.  HEART:  Is regular without murmur.  EXTREMITIES:  No clubbing, cyanosis and there is no edema.  She had a large  bruise on her left elbow and a very small less than 1 cm open area which is  clean.  She has no other significant lesions or breakdown noted.  NEUROLOGIC:  Is intact.  There is no tremor.  Strength is good.  Mental  status was intact.   ASSESSMENT/PLAN:  1.  Fall with gait instability.  The patient will proceed with rehab.  2.  History of anxiety disorder stable on increased Paxil.  She occasionally      took Restoril at home. This could be managed expectantly at the nursing      facility.  3.  Urinary frequency.  It seems worse over the last of couple days.  May be      related to the withhold of the Detrol XL.  A UA is pending at the time      of discharge. This needs to be followed up by the nursing facility and      treated as indicated.  She will continue her Flovent and Combivent.      Could consider Advair at some point.  Plan is discharged to independent      living once she completes short-term rehab.  Family is aware of the plan      as is the patient.      Hanley Hays Josefine Class, M.D.  Electronically Signed     FED/MEDQ  D:  08/04/2005  T:  08/04/2005  Job:  [id number]

## 2011-04-11 NOTE — Consult Note (Signed)
[name], DOBLER              ACCOUNT NO.:  [id number]   MEDICAL RECORD NO.:  [id number]          PATIENT TYPE:  INP   LOCATION:  A318                          FACILITY:  [facility]   PHYSICIAN:  J. Darreld Mclean, M.D. DATE OF BIRTH:  07-09-1923   DATE OF CONSULTATION:  07/30/2005  DATE OF DISCHARGE:                                   CONSULTATION   HISTORY OF PRESENT ILLNESS:  The patient fell and injured her left pelvis  and hip area.  X-ray showed nondisplaced fracture of the less pubic bone  near the symphysis area.  She is comfortable in laying in bed.  Neurologically intact.  Neurovascularly intact.  No other apparent injuries.  Very minimal hip pain.   IMPRESSION:  Fracture of pelvis and left pubic bone, nondisplaced.   PLAN:  A __________ today, continue analgesia and PT/OT.  Tomorrow with  walker, toe touch weightbearing.  I have gone ahead and ordered the PT.  Will follow.           ______________________________  [name]. Darreld Mclean, M.D.     JWK/MEDQ  D:  07/30/2005  T:  07/30/2005  Job:  [id number]

## 2011-04-15 ENCOUNTER — Other Ambulatory Visit: Payer: Self-pay | Admitting: Urology

## 2011-04-15 ENCOUNTER — Encounter (HOSPITAL_COMMUNITY): Payer: Medicare Other

## 2011-04-15 ENCOUNTER — Ambulatory Visit (HOSPITAL_COMMUNITY)
Admission: RE | Admit: 2011-04-15 | Discharge: 2011-04-15 | Disposition: A | Discharge status: Home / Self Care | Payer: Medicare Other | Source: Ambulatory Visit | Attending: Urology | Admitting: Urology

## 2011-04-15 DIAGNOSIS — J449 Chronic obstructive pulmonary disease, unspecified: Secondary | ICD-10-CM

## 2011-04-15 DIAGNOSIS — Z01812 Encounter for preprocedural laboratory examination: Secondary | ICD-10-CM | POA: Insufficient documentation

## 2011-04-15 DIAGNOSIS — Z01810 Encounter for preprocedural cardiovascular examination: Secondary | ICD-10-CM | POA: Insufficient documentation

## 2011-04-15 DIAGNOSIS — J4489 Other specified chronic obstructive pulmonary disease: Secondary | ICD-10-CM | POA: Insufficient documentation

## 2011-04-15 DIAGNOSIS — Z01818 Encounter for other preprocedural examination: Secondary | ICD-10-CM | POA: Insufficient documentation

## 2011-04-15 LAB — CBC
HCT: 44.7 % (ref 36.0–46.0)
Hemoglobin: 14.5 g/dL (ref 12.0–15.0)
MCHC: 32.4 g/dL (ref 30.0–36.0)
Platelets: 268 10*3/uL (ref 150–400)
RBC: 4.59 MIL/uL (ref 3.87–5.11)
RDW: 13.6 % (ref 11.5–15.5)
WBC: 8.4 10*3/uL (ref 4.0–10.5)

## 2011-04-15 LAB — BASIC METABOLIC PANEL
CO2: 27 mEq/L (ref 19–32)
Calcium: 9.6 mg/dL (ref 8.4–10.5)
GFR calc non Af Amer: >60 mL/min (ref >60)
Potassium: 4.3 mEq/L (ref 3.5–5.1)

## 2011-04-17 ENCOUNTER — Ambulatory Visit (INDEPENDENT_AMBULATORY_CARE_PROVIDER_SITE_OTHER): Payer: Medicare Other | Admitting: Internal Medicine

## 2011-04-18 LAB — URINE CULTURE

## 2011-04-22 ENCOUNTER — Inpatient Hospital Stay (HOSPITAL_COMMUNITY)
Admission: RE | Admit: 2011-04-22 | Discharge: 2011-04-24 | DRG: 661 | Disposition: A | Discharge status: Home / Self Care | Payer: Medicare Other | Source: Ambulatory Visit | Attending: Urology | Admitting: Urology

## 2011-04-22 ENCOUNTER — Inpatient Hospital Stay (HOSPITAL_COMMUNITY): Payer: Medicare Other

## 2011-04-22 ENCOUNTER — Inpatient Hospital Stay (HOSPITAL_COMMUNITY)
Admission: RE | Admit: 2011-04-22 | Discharge: 2011-04-22 | Disposition: A | Discharge status: Home / Self Care | Payer: Medicare Other | Source: Ambulatory Visit | Attending: Urology | Admitting: Urology

## 2011-04-22 DIAGNOSIS — IMO0002 Reserved for concepts with insufficient information to code with codable children: Secondary | ICD-10-CM | POA: Diagnosis not present

## 2011-04-22 DIAGNOSIS — N200 Calculus of kidney: Principal | ICD-10-CM | POA: Diagnosis present

## 2011-04-22 DIAGNOSIS — E119 Type 2 diabetes mellitus without complications: Secondary | ICD-10-CM | POA: Diagnosis present

## 2011-04-22 DIAGNOSIS — R002 Palpitations: Secondary | ICD-10-CM | POA: Diagnosis present

## 2011-04-22 LAB — GLUCOSE, CAPILLARY: Glucose-Capillary: 154 mg/dL — ABNORMAL HIGH (ref 70–99)

## 2011-04-22 LAB — TYPE AND SCREEN: ABO/RH(D): B POS

## 2011-04-22 MED ORDER — IOHEXOL 300 MG/ML  SOLN
50.0000 mL | Freq: Once | INTRAMUSCULAR | Status: AC | PRN
  Administered 2011-04-22: 30 mL via INTRATHECAL

## 2011-04-23 ENCOUNTER — Inpatient Hospital Stay (HOSPITAL_COMMUNITY): Payer: Medicare Other

## 2011-04-23 LAB — GLUCOSE, CAPILLARY
Glucose-Capillary: 64 mg/dL — ABNORMAL LOW (ref 70–99)
Glucose-Capillary: 94 mg/dL (ref 70–99)
Glucose-Capillary: 80 mg/dL (ref 70–99)

## 2011-04-23 NOTE — Op Note (Signed)
[name], [name]              ACCOUNT NO.:  [id number]  MEDICAL RECORD NO.:  [id number]           PATIENT TYPE:  I  LOCATION:  1402                         FACILITY:  [facility]  PHYSICIAN:  Excell Seltzer. Annabell Howells, M.D.    DATE OF BIRTH:  1923-06-28  DATE OF PROCEDURE:  04/22/2011 DATE OF DISCHARGE:                              OPERATIVE REPORT   PROCEDURE:  Left percutaneous nephrolithotomy for 2.2-cm stone.  PREOPERATIVE DIAGNOSIS:  A 2.2-cm left renal pelvic stone.  POSTOPERATIVE DIAGNOSIS:  A 2.2-cm left renal pelvic stone.  SURGEON:  Excell Seltzer. Annabell Howells, M.D.  ANESTHESIA:  General.  SPECIMENS:  Stone fragments given to family to bring to our office for analysis.  BLOOD LOSS:  Minimal.  DRAINS:  A 22-French Foley nephrostomy tube, 16-French Foley urethral catheter and 6 x 24 cm left double-J stent.  COMPLICATIONS:  None.  INDICATIONS:  [name] is an 77 year old white female with recurrent urinary tract infections.  She has been found to have partially obstructing 2.2 cm left renal pelvic stone that was felt to be best managed with percutaneous nephrolithotomy.  FINDINGS AND PROCEDURE:  She had had recent culture that grew Enterococcus sensitive to ampicillin and was switched to that medicine over the last couple of days.  She was also given vancomycin 1 g IV today for further gram-positive coverage.  She was taken to the operating room where she was fitted with PAS hose.  General anesthetic was induced on the holding room stretcher.  She was then rolled prone on chest rolls with arms tucked.  A Foley catheter was inserted using sterile technique on the holding room stretcher.  She was then rolled prone on chest rolls with great care being taken to pad all pressure points.  The nephrostomy tube had been placed this morning by interventional radiology by Dr. Rudell Cobb.  The tube site was prepped with Betadine solution.  She was draped in usual sterile fashion.  Time-out was  performed.  A guidewire was then passed to the bladder under fluoroscopic guidance and the nephrostomy catheter was removed.  The incision was extended and a 10-French peel-away sheath was placed over the wire to the proximal ureter.  The inner core was removed and a safety wire was then passed to the bladder.  The peel-away sheath was removed and the track master balloon was then placed over the working wire.  Once the tip of the balloon was adjacent to the stone, the balloon was inflated to 16 atmospheres and the nephrostomy working sheath was then advanced over the balloon.  The balloon was deflated and removed leaving the sheath in place.  A 24-French rigid nephroscope was then passed through the sheath and the stone was readily identified.  There was minimal bleeding or clots.  The stone was then engaged with percutaneous lithotrite and fragmented into several manageable fragments which were  removed with 2 or 3 prong graspers.  After the renal pelvis had cleared, fluoroscopy revealed some residual fragments in the upper pole.  These were easily accessed with the rigid nephroscope and removed.  Followup fluoroscopy revealed no residual fragments.  No stone  was readily visualized fluoroscopically, so I did not feel flexible nephroscopy was indicated.  At this point, a 6-French x 24 cm double-J stent was passed over the working wire to the bladder.  The wire was backed off leaving a good coil in the bladder and good coil in the renal pelvis.  The safety wire was then removed under fluoroscopic guidance to ensure the stent did not pull back and a 22-French Council Foley catheter was placed through the working sheath into the renal pelvis and actually was advanced into the upper pole calix.  The catheter balloon was filled with 3 cc of sterile fluid after removal of the sheath.  The catheter was then secured to the skin with a 2-0 silk suture.  The nephrostomy sheath was cut away  from the Foley, the Foley was placed to straight drainage.  The drapes were removed and the routine dressing was applied.  The nephrostomy tube was placed to straight drainage and secured to the patient's left thigh.  At this point, she was rolled back supine on the recovery room stretcher, anesthetic was reversed and she was moved to recovery room in stable condition.  There were no complications.     Excell Seltzer. Annabell Howells, M.D.     JJW/MEDQ  D:  04/22/2011  T:  04/22/2011  Job:  [id number]  cc:   Ramon Dredge L. [name], M.D. Fax: [phone]  Electronically Signed by Bjorn Pippin M.D. on 04/23/2011 10:17:08 AM

## 2011-04-24 ENCOUNTER — Inpatient Hospital Stay (HOSPITAL_COMMUNITY): Admission: RE | Admit: 2011-04-24 | Payer: Medicare Other | Source: Ambulatory Visit | Admitting: Urology

## 2011-04-24 LAB — GLUCOSE, CAPILLARY
Glucose-Capillary: 104 mg/dL — ABNORMAL HIGH (ref 70–99)
Glucose-Capillary: 106 mg/dL — ABNORMAL HIGH (ref 70–99)

## 2011-04-29 ENCOUNTER — Ambulatory Visit (INDEPENDENT_AMBULATORY_CARE_PROVIDER_SITE_OTHER): Payer: Medicare Other | Admitting: Urology

## 2011-04-29 DIAGNOSIS — N200 Calculus of kidney: Secondary | ICD-10-CM

## 2011-05-25 NOTE — Discharge Summary (Signed)
[name], [name]              ACCOUNT NO.:  [id number]  MEDICAL RECORD NO.:  [id number]  LOCATION:  1402                         FACILITY:  [facility]  PHYSICIAN:  Excell Seltzer. Annabell Howells, M.D.    DATE OF BIRTH:  1923/09/16  DATE OF ADMISSION:  04/22/2011 DATE OF DISCHARGE:  04/24/2011                              DISCHARGE SUMMARY   HISTORY:  Briefly, [name] is an 77 year old white female with recurrent urinary tract infections, who was found to have a 2.2 cm left partial staghorn stone in the renal pelvis with some obstruction and it was felt that percutaneous nephrolithotomy was indicated.  PAST MEDICAL HISTORY:  Her past history is pertinent for: 1. Anxiety. 2. Arthritis. 3. Asthma. 4. COPD. 5. Dementia. 6. Depression. 7. Diabetes mellitus. 8. Esophageal reflux. 9. Hiatal hernia. 10.Insomnia.  PAST SURGICAL HISTORY:  Significant for: 1. Appendectomy. 2. Cataract surgery. 3. Nissen fundoplication. 4. Hemorrhoidectomy. 5. Hernia repair. 6. Cystoscopy.  ADMISSION MEDICATIONS:  Included: 1. Ambien. 2. Aricept 10 grams daily. 3. Estrace cream. 4. Fluconazole. 5. Eurobel. 6. Wellbutrin.  ALLERGIES:  She has allergies to: 1. NITROFURANTOIN. 2. OXYBUTYNIN. 3. SULFA. 4. CIPRO. 5. QUININE.  For additional details of the history and physical, please see the office H and PATIENT.  ACCESSORY CLINICAL INFORMATION:  Chemistries on Apr 22, 2011, her glucose was 154.  HOSPITAL COURSE:  She had been on ampicillin and on the day of admission she was taken to radiology where she underwent placement of a left percutaneous nephrostomy tube.  She was then taken to the operating room.  She was given vancomycin for additional antibiotic coverage.  She underwent a successful left percutaneous nephrolithotomy with removal of all of the stone fragments.  She was left with a 22-French counsel catheter postoperatively as well as a internal double-J stent.  Her postoperative course  was unremarkable.  She had some initial bladder irritation and some bloody urine, but no clots.  She was given Pyridium, that evening though she did become grossly confused and paranoid and a sitter was ordered.  On the first postoperative day, a KUB was obtained and the tube appeared to be out of the kidney, so it was removed.  The Foley catheter was left indwelling because the internal stent to ensure that she did not have any leakage.  On Apr 24, 2011, she was doing well. She was without complaints, but still remained a bit agitated, but she was felt to be safe for discharge.  She was afebrile.  Her wound was dry and the Foley was draining clear urine.  The Foley was removed.  Her final diagnosis is left renal stone, history of recurrent UTIs.  Her admission was complicated by some postop agitation.  Her discharge medications include ampicillin and Vicodin in addition to resumption of her home meds.  She will follow up with me in [place] on Friday after discharge.  Her condition is improved.  Her disposition is to home.  Her stent will be removed at follow-up.     Excell Seltzer. Annabell Howells, M.D.     JJW/MEDQ  D:  05/05/2011  T:  05/05/2011  Job:  [id number]  cc:   Ramon Dredge L. [name], M.D.  Fax: [phone]  Excell Seltzer. Annabell Howells, M.D. Fax: [phone]  Electronically Signed by Bjorn Pippin M.D. on 05/25/2011 12:08:08 PM

## 2011-05-30 ENCOUNTER — Ambulatory Visit (INDEPENDENT_AMBULATORY_CARE_PROVIDER_SITE_OTHER): Payer: Medicare Other | Admitting: Urology

## 2011-05-30 DIAGNOSIS — Z87442 Personal history of urinary calculi: Secondary | ICD-10-CM

## 2011-05-30 DIAGNOSIS — R300 Dysuria: Secondary | ICD-10-CM

## 2011-05-30 DIAGNOSIS — N3020 Other chronic cystitis without hematuria: Secondary | ICD-10-CM

## 2011-07-24 ENCOUNTER — Encounter (INDEPENDENT_AMBULATORY_CARE_PROVIDER_SITE_OTHER): Payer: Self-pay | Admitting: *Deleted

## 2011-07-24 ENCOUNTER — Encounter (INDEPENDENT_AMBULATORY_CARE_PROVIDER_SITE_OTHER): Payer: Self-pay

## 2011-08-20 ENCOUNTER — Telehealth (INDEPENDENT_AMBULATORY_CARE_PROVIDER_SITE_OTHER): Payer: Self-pay | Admitting: *Deleted

## 2011-08-20 ENCOUNTER — Ambulatory Visit (INDEPENDENT_AMBULATORY_CARE_PROVIDER_SITE_OTHER): Payer: Medicare Other | Admitting: Internal Medicine

## 2011-08-20 ENCOUNTER — Encounter (INDEPENDENT_AMBULATORY_CARE_PROVIDER_SITE_OTHER): Payer: Self-pay | Admitting: Internal Medicine

## 2011-08-20 VITALS — BP 130/64 | HR 60 | Temp 97.0°F | Ht 62.0 in | Wt 158.0 lb

## 2011-08-20 DIAGNOSIS — R935 Abnormal findings on diagnostic imaging of other abdominal regions, including retroperitoneum: Secondary | ICD-10-CM

## 2011-08-20 DIAGNOSIS — N824 Other female intestinal-genital tract fistulae: Secondary | ICD-10-CM

## 2011-08-20 NOTE — Progress Notes (Signed)
Subjective:     Patient ID: [name], female   DOB: 01-26-1923, 77 y.o.   MRN: [id number]  HPI Her daughter in present in the room.  [name] is a 77 yr old female referred to our office for ? Colovaginal fistula noted on CT in April of this year.  She underwent to CT 03/17/2011  for Left upper pain.  The CT reveal a renal calculi, the largest representing a staghorn calculus on the left with slight fullness of the left pelvocaliceal system. Multiple colonic diverticula. Some fluid and air within the vagina. Cannot exclude colovaginal fistula. Elevation of the rt hemidiaphragm.   She denies vaginal discharge. She has a BM one a day. No bright red rectal bleeding or melena.Appetite is good. No weight loss. No abdominal pain at this time.  Her last colonoscopy has been over 10 yr ago by Dr. Karilyn Cota.  On 04/22/2011 she underwent a left percutaneous nephrolithotomy for a 2.2 cm stone.    Review of Systems  see  hpi    Current Outpatient Prescriptions  Medication Sig Dispense Refill  . buPROPion (WELLBUTRIN SR) 100 MG 12 hr tablet Take by mouth 2 (two) times daily.        Marland Kitchen donepezil (ARICEPT) 10 MG tablet Take 10 mg by mouth at bedtime as needed.        Marland Kitchen estradiol (ESTRACE) 0.1 MG/GM vaginal cream Place 2 g vaginally daily.        . Meth-Hyo-M Bl-Na Phos-Ph Sal (URIBEL) 118 MG CAPS Take by mouth.        . metroNIDAZOLE (METROCREAM) 0.75 % cream Apply topically 2 (two) times daily.         Allergies  Allergen Reactions  . Ciprocin-Fluocin-Procin (Fluocinolone Acetonide)   . Nitrofurantoin   . Oxybutynin   . Quinine Sulfate   . Sulfa Antibiotics      Past Surgical History  Procedure Date  . Appendectomy   . Hernia repair   . Cataract surgery   . Nissen fundoplication   . Hemorrhoid surgery   . Cystoscopy   . Left kidney sturgery for kidney stones    Past Medical History  Diagnosis Date  . Fistula     COLOVAGINAL  . Diverticulitis   . Other chronic cystitis   . Dysuria   .  Incomplete bladder emptying   . Nocturia   . Postmenopausal atrophic vaginitis   . Other specified symptom associated with female genital organs   . Anxiety state, unspecified   . Personal history of arthritis   . Unspecified asthma   . Chronic airway obstruction, not elsewhere classified   . Other persistent mental disorders due to conditions classified elsewhere   . Depressive disorder, not elsewhere classified   . Type II or unspecified type diabetes mellitus without mention of complication, not stated as uncontrolled   . Esophageal reflux   . Diaphragmatic hernia without mention of obstruction or gangrene   . Insomnia, unspecified   . Other chronic cystitis   . Postmenopausal atrophic vaginitis   . Dysuria    History reviewed. No pertinent family history. Family Status  Relation Status Death Age  . Mother Deceased     CAD, CVA  . Father Deceased     Kidney problems  . Sister Other     One deceased fomr DM.  One has demenita and DM and the other  is  okay.  . Brother      Four deeased, Rest are in fair  health   History   Social History Narrative  . No narrative on file   History   Social History Narrative  . No narrative on file   History   Social History Narrative  . No narrative on file   History   Social History  . Marital Status: Widowed    Spouse Name: N/A    Number of Children: N/A  . Years of Education: N/A   Occupational History  . Not on file.   Social History Main Topics  . Smoking status: Never Smoker   . Smokeless tobacco: Not on file  . Alcohol Use: No  . Drug Use: No  . Sexually Active: Not on file   Other Topics Concern  . Not on file   Social History Narrative  . No narrative on file   History   Social History Narrative  . No narrative on file        Objective:   Physical Exam  Filed Vitals:   08/20/11 1501  BP: 130/64  Pulse: 60  Temp: 97 F (36.1 C)  Height: 5\' 2"  (1.575 m)  Weight: 158 lb (71.668 kg)     Alert and oriented. Skin warm and dry. Oral mucosa is moist. Natural teeth in good condition, several missing however. Sclera anicteric, conjunctivae is pink. Thyroid not enlarged. No cervical lymphadenopathy. Lungs clear. Heart regular rate and rhythm.  Abdomen is soft. Bowel sounds are positive. No hepatomegaly. No abdominal masses felt. No tenderness. No vaginal discharge noted. No drainage to panties.  No edema to lower extremities. Patient is alert and oriented.     Assessment:    Abnormal CT with a possible colovaginal fistula. I discussed this case with Dr. Karilyn Cota.     Plan:    Will schedule a flexible sigmoidoscopy with Dr Karilyn Cota.The patient and daughter are agreeable with this procedure. The risks and benefits such as perforation, bleeding, and infection were reviewed with the patient and is agreeable.

## 2011-08-20 NOTE — Telephone Encounter (Signed)
Flex Sig sch'd 09/17/11 @ 8:30 (7:30) instructions given

## 2011-08-21 ENCOUNTER — Encounter (INDEPENDENT_AMBULATORY_CARE_PROVIDER_SITE_OTHER): Payer: Self-pay | Admitting: Internal Medicine

## 2011-08-26 LAB — CBC
HCT: 46.2 — ABNORMAL HIGH
Hemoglobin: 15.6 — ABNORMAL HIGH
MCHC: 33.8
MCV: 94.4
Platelets: 292
RBC: 4.89
RDW: 13.2
WBC: 19.9 — ABNORMAL HIGH

## 2011-08-26 LAB — URINE CULTURE
Colony Count: NO GROWTH
Culture: NO GROWTH

## 2011-08-26 LAB — URINALYSIS, ROUTINE W REFLEX MICROSCOPIC
Glucose, UA: 500 — AB
Ketones, ur: NEGATIVE
pH: 6

## 2011-08-26 LAB — BASIC METABOLIC PANEL WITH GFR
BUN: 10
CO2: 24
Calcium: 9.2
Creatinine, Ser: 0.77
GFR calc Af Amer: >60
Glucose, Bld: 203 — ABNORMAL HIGH

## 2011-08-26 LAB — URINE MICROSCOPIC-ADD ON

## 2011-08-26 LAB — GLUCOSE, CAPILLARY: Glucose-Capillary: 196 — ABNORMAL HIGH

## 2011-08-26 LAB — DIFFERENTIAL
Basophils Absolute: 0.1
Basophils Relative: 0
Eosinophils Absolute: 0
Eosinophils Relative: 0
Lymphocytes Relative: 7 — ABNORMAL LOW
Lymphs Abs: 1.3
Monocytes Absolute: 1.6 — ABNORMAL HIGH
Monocytes Relative: 8
Neutro Abs: 16.9 — ABNORMAL HIGH
Neutrophils Relative %: 85 — ABNORMAL HIGH

## 2011-08-26 LAB — BASIC METABOLIC PANEL
Chloride: 100
GFR calc non Af Amer: >60
Potassium: 3.7
Sodium: 133 — ABNORMAL LOW

## 2011-08-27 ENCOUNTER — Other Ambulatory Visit: Payer: Self-pay | Admitting: Urology

## 2011-08-27 ENCOUNTER — Ambulatory Visit (HOSPITAL_COMMUNITY)
Admission: RE | Admit: 2011-08-27 | Discharge: 2011-08-27 | Disposition: A | Discharge status: Home / Self Care | Payer: Medicare Other | Source: Ambulatory Visit | Attending: Urology | Admitting: Urology

## 2011-08-27 DIAGNOSIS — N200 Calculus of kidney: Secondary | ICD-10-CM

## 2011-08-27 DIAGNOSIS — R109 Unspecified abdominal pain: Secondary | ICD-10-CM | POA: Insufficient documentation

## 2011-08-27 DIAGNOSIS — R9389 Abnormal findings on diagnostic imaging of other specified body structures: Secondary | ICD-10-CM | POA: Insufficient documentation

## 2011-08-29 ENCOUNTER — Ambulatory Visit (INDEPENDENT_AMBULATORY_CARE_PROVIDER_SITE_OTHER): Payer: Medicare Other | Admitting: Urology

## 2011-08-29 DIAGNOSIS — N3020 Other chronic cystitis without hematuria: Secondary | ICD-10-CM

## 2011-08-29 DIAGNOSIS — N200 Calculus of kidney: Secondary | ICD-10-CM

## 2011-09-03 LAB — BASIC METABOLIC PANEL
BUN: 15
BUN: 15
BUN: 19
BUN: 19
BUN: 21
BUN: 26 — ABNORMAL HIGH
CO2: 24
CO2: 27
CO2: 27
CO2: 29
CO2: 30
Calcium: 7.7 — ABNORMAL LOW
Calcium: 7.8 — ABNORMAL LOW
Calcium: 7.9 — ABNORMAL LOW
Calcium: 7.9 — ABNORMAL LOW
Calcium: 8.1 — ABNORMAL LOW
Calcium: 8.4
Chloride: 102
Chloride: 103
Chloride: 108
Chloride: 109
Chloride: 111
Creatinine, Ser: 0.56
Creatinine, Ser: 0.58
Creatinine, Ser: 0.63
Creatinine, Ser: 0.7
Creatinine, Ser: 0.72
Creatinine, Ser: 0.76
GFR calc Af Amer: >60
GFR calc Af Amer: >60
GFR calc Af Amer: >60
GFR calc Af Amer: >60
GFR calc Af Amer: >60
GFR calc Af Amer: >60
GFR calc non Af Amer: >60
GFR calc non Af Amer: >60
GFR calc non Af Amer: >60
Glucose, Bld: 109 — ABNORMAL HIGH
Glucose, Bld: 138 — ABNORMAL HIGH
Glucose, Bld: 149 — ABNORMAL HIGH
Glucose, Bld: 163 — ABNORMAL HIGH
Glucose, Bld: 81
Potassium: 4
Potassium: 4
Potassium: 4
Potassium: 4.3
Potassium: 4.4
Sodium: 136
Sodium: 138
Sodium: 139
Sodium: 139
Sodium: 140
Sodium: 141
Sodium: 142

## 2011-09-03 LAB — POCT I-STAT 3, ART BLOOD GAS (G3+)
Bicarbonate: 24.8 — ABNORMAL HIGH
Bicarbonate: 25.4 — ABNORMAL HIGH
O2 Saturation: 100
Operator id: 155841
Patient temperature: 97.2
TCO2: 26
TCO2: 26
pCO2 arterial: 36
pCO2 arterial: 39.3
pH, Arterial: 7.405 — ABNORMAL HIGH
pH, Arterial: 7.453 — ABNORMAL HIGH
pO2, Arterial: 284 — ABNORMAL HIGH
pO2, Arterial: 49 — ABNORMAL LOW

## 2011-09-03 LAB — CBC
HCT: 32.3 — ABNORMAL LOW
HCT: 32.4 — ABNORMAL LOW
HCT: 32.4 — ABNORMAL LOW
HCT: 33.7 — ABNORMAL LOW
HCT: 33.8 — ABNORMAL LOW
HCT: 34 — ABNORMAL LOW
HCT: 34.1 — ABNORMAL LOW
HCT: 34.7 — ABNORMAL LOW
HCT: 39.8
Hemoglobin: 10.8 — ABNORMAL LOW
Hemoglobin: 10.9 — ABNORMAL LOW
Hemoglobin: 11 — ABNORMAL LOW
Hemoglobin: 11.2 — ABNORMAL LOW
Hemoglobin: 11.4 — ABNORMAL LOW
Hemoglobin: 11.4 — ABNORMAL LOW
Hemoglobin: 11.5 — ABNORMAL LOW
Hemoglobin: 11.7 — ABNORMAL LOW
MCHC: 33.3
MCHC: 33.5
MCHC: 33.5
MCHC: 33.5
MCHC: 33.6
MCHC: 33.7
MCHC: 33.8
MCHC: 33.9
MCV: 94.1
MCV: 94.6
MCV: 94.7
MCV: 94.7
MCV: 94.9
MCV: 94.9
MCV: 95.5
MCV: 95.6
MCV: 96
Platelets: 240
Platelets: 249
Platelets: 256
Platelets: 256
Platelets: 265
Platelets: 268
Platelets: 276
Platelets: 285
Platelets: 308
RBC: 3.41 — ABNORMAL LOW
RBC: 3.44 — ABNORMAL LOW
RBC: 3.51 — ABNORMAL LOW
RBC: 3.56 — ABNORMAL LOW
RBC: 3.56 — ABNORMAL LOW
RBC: 3.6 — ABNORMAL LOW
RBC: 3.63 — ABNORMAL LOW
RDW: 13.6
RDW: 13.8
RDW: 14
RDW: 14
RDW: 14.2 — ABNORMAL HIGH
RDW: 14.3 — ABNORMAL HIGH
RDW: 14.6 — ABNORMAL HIGH
WBC: 10.8 — ABNORMAL HIGH
WBC: 13.2 — ABNORMAL HIGH
WBC: 13.3 — ABNORMAL HIGH
WBC: 15 — ABNORMAL HIGH
WBC: 16.2 — ABNORMAL HIGH
WBC: 17.4 — ABNORMAL HIGH
WBC: 18.8 — ABNORMAL HIGH
WBC: 21.2 — ABNORMAL HIGH

## 2011-09-03 LAB — BLOOD GAS, ARTERIAL
Bicarbonate: 27.9 — ABNORMAL HIGH
Bicarbonate: 29.9 — ABNORMAL HIGH
FIO2: 0.4
FIO2: 40
MECHVT: 360
O2 Saturation: 91.6
PEEP: 8
PEEP: 8
Patient temperature: 37
Patient temperature: 37
RATE: 17
TCO2: 23
TCO2: 25.2
pCO2 arterial: 36.7
pCO2 arterial: 42.7
pH, Arterial: 7.45 — ABNORMAL HIGH
pH, Arterial: 7.459 — ABNORMAL HIGH
pO2, Arterial: 76.1 — ABNORMAL LOW

## 2011-09-03 LAB — CULTURE, BAL-QUANTITATIVE W GRAM STAIN

## 2011-09-03 LAB — COMPREHENSIVE METABOLIC PANEL
ALT: 19
AST: 17
Albumin: 2.5 — ABNORMAL LOW
Alkaline Phosphatase: 67
BUN: 33 — ABNORMAL HIGH
CO2: 31
Calcium: 7.4 — ABNORMAL LOW
Chloride: 107
Creatinine, Ser: 0.66
GFR calc Af Amer: >60
GFR calc non Af Amer: >60
Glucose, Bld: 72
Potassium: 3.9
Sodium: 143
Total Bilirubin: 0.4
Total Protein: 4.8 — ABNORMAL LOW

## 2011-09-03 LAB — DIFFERENTIAL
Basophils Relative: 0
Eosinophils Absolute: 0
Eosinophils Absolute: 0
Eosinophils Relative: 0
Lymphs Abs: 0.5 — ABNORMAL LOW
Lymphs Abs: 0.6 — ABNORMAL LOW
Monocytes Absolute: 0.5
Monocytes Absolute: 0.8 — ABNORMAL HIGH
Neutro Abs: 19.8 — ABNORMAL HIGH
Neutrophils Relative %: 93 — ABNORMAL HIGH

## 2011-09-03 LAB — URINE CULTURE
Colony Count: >=100000
Special Requests: NEGATIVE

## 2011-09-04 LAB — DIFFERENTIAL
Basophils Absolute: 0
Basophils Absolute: 0
Basophils Absolute: 0
Basophils Absolute: 0
Basophils Absolute: 0
Basophils Absolute: 0
Basophils Relative: 0
Basophils Relative: 0
Basophils Relative: 0
Basophils Relative: 0
Basophils Relative: 0
Eosinophils Absolute: 0
Eosinophils Absolute: 0
Eosinophils Absolute: 0
Eosinophils Relative: 0
Eosinophils Relative: 0
Eosinophils Relative: 0
Eosinophils Relative: 0
Eosinophils Relative: 0
Eosinophils Relative: 0
Eosinophils Relative: 0
Lymphocytes Relative: 1 — ABNORMAL LOW
Lymphocytes Relative: 2 — ABNORMAL LOW
Lymphocytes Relative: 2 — ABNORMAL LOW
Lymphocytes Relative: 2 — ABNORMAL LOW
Lymphocytes Relative: 2 — ABNORMAL LOW
Lymphocytes Relative: 3 — ABNORMAL LOW
Lymphocytes Relative: 3 — ABNORMAL LOW
Lymphocytes Relative: 8 — ABNORMAL LOW
Lymphs Abs: 0.2 — ABNORMAL LOW
Lymphs Abs: 0.5 — ABNORMAL LOW
Lymphs Abs: 0.5 — ABNORMAL LOW
Lymphs Abs: 1.4
Lymphs Abs: 2.4
Monocytes Absolute: 0.3
Monocytes Absolute: 0.8 — ABNORMAL HIGH
Monocytes Absolute: 1 — ABNORMAL HIGH
Monocytes Absolute: 1.1 — ABNORMAL HIGH
Monocytes Absolute: 2.8 — ABNORMAL HIGH
Monocytes Relative: 12 — ABNORMAL HIGH
Monocytes Relative: 2 — ABNORMAL LOW
Monocytes Relative: 4
Monocytes Relative: 4
Monocytes Relative: 5
Monocytes Relative: 5
Myelocytes: 0
Neutro Abs: 14.5 — ABNORMAL HIGH
Neutro Abs: 14.8 — ABNORMAL HIGH
Neutro Abs: 17.4 — ABNORMAL HIGH
Neutro Abs: 17.9 — ABNORMAL HIGH
Neutro Abs: 18.8 — ABNORMAL HIGH
Neutro Abs: 20 — ABNORMAL HIGH
Neutro Abs: 20.1 — ABNORMAL HIGH
Neutro Abs: 26.5 — ABNORMAL HIGH
Neutrophils Relative %: 49
Neutrophils Relative %: 62
Neutrophils Relative %: 82 — ABNORMAL HIGH
Neutrophils Relative %: 91 — ABNORMAL HIGH
Neutrophils Relative %: 94 — ABNORMAL HIGH
Neutrophils Relative %: 94 — ABNORMAL HIGH
Neutrophils Relative %: 94 — ABNORMAL HIGH
Neutrophils Relative %: 95 — ABNORMAL HIGH
Neutrophils Relative %: 97 — ABNORMAL HIGH
Promyelocytes Absolute: 0
Promyelocytes Absolute: 0
WBC Morphology: INCREASED
WBC Morphology: INCREASED
WBC Morphology: INCREASED
nRBC: 0

## 2011-09-04 LAB — BLOOD GAS, ARTERIAL
Acid-Base Excess: 10.4 — ABNORMAL HIGH
Acid-Base Excess: 11.2 — ABNORMAL HIGH
Acid-Base Excess: 11.8 — ABNORMAL HIGH
Acid-Base Excess: 12.5 — ABNORMAL HIGH
Acid-Base Excess: 13.1 — ABNORMAL HIGH
Acid-Base Excess: 5.5 — ABNORMAL HIGH
Acid-Base Excess: 6.4 — ABNORMAL HIGH
Acid-Base Excess: 8.1 — ABNORMAL HIGH
Acid-Base Excess: 8.9 — ABNORMAL HIGH
Acid-Base Excess: 8.9 — ABNORMAL HIGH
Bicarbonate: 15.9 — ABNORMAL LOW
Bicarbonate: 17.8 — ABNORMAL LOW
Bicarbonate: 20.3
Bicarbonate: 21.9
Bicarbonate: 26 — ABNORMAL HIGH
Bicarbonate: 28.8 — ABNORMAL HIGH
Bicarbonate: 30 — ABNORMAL HIGH
Bicarbonate: 30.9 — ABNORMAL HIGH
Bicarbonate: 31.9 — ABNORMAL HIGH
Bicarbonate: 32.1 — ABNORMAL HIGH
Bicarbonate: 32.4 — ABNORMAL HIGH
Bicarbonate: 32.8 — ABNORMAL HIGH
Bicarbonate: 33.4 — ABNORMAL HIGH
Bicarbonate: 33.9 — ABNORMAL HIGH
Bicarbonate: 34.6 — ABNORMAL HIGH
Bicarbonate: 34.6 — ABNORMAL HIGH
Bicarbonate: 35.7 — ABNORMAL HIGH
Bicarbonate: 36.8 — ABNORMAL HIGH
Bicarbonate: 37.7 — ABNORMAL HIGH
FIO2: 0.5
FIO2: 0.8
FIO2: 1
FIO2: 1
FIO2: 100
FIO2: 100
FIO2: 40
FIO2: 60
FIO2: 70
FIO2: 90
MECHVT: 350
MECHVT: 360
MECHVT: 360
MECHVT: 360
MECHVT: 360
MECHVT: 360
MECHVT: 360
MECHVT: 360
MECHVT: 360
MECHVT: 420
MECHVT: 470
MECHVT: 480
MECHVT: 600
MECHVT: 600
O2 Content: 2
O2 Content: 60
O2 Content: 8
O2 Saturation: 87.6
O2 Saturation: 88.8
O2 Saturation: 91.2
O2 Saturation: 92
O2 Saturation: 92.3
O2 Saturation: 92.9
O2 Saturation: 92.9
O2 Saturation: 93.1
O2 Saturation: 93.2
O2 Saturation: 93.6
O2 Saturation: 94.7
O2 Saturation: 95.3
O2 Saturation: 96.5
O2 Saturation: 97.1
O2 Saturation: 97.3
PEEP: 10
PEEP: 10
PEEP: 10
PEEP: 10
PEEP: 10
PEEP: 10
PEEP: 10
PEEP: 12
PEEP: 14
PEEP: 14
PEEP: 14
PEEP: 5
PEEP: 5
PEEP: 8
PEEP: 8
PEEP: 8
Patient temperature: 37
Patient temperature: 37
Patient temperature: 37
Patient temperature: 37
Patient temperature: 37
Patient temperature: 37
Patient temperature: 37
Patient temperature: 37
Patient temperature: 37
Patient temperature: 37
Patient temperature: 37
Patient temperature: 37
Patient temperature: 37
Patient temperature: 37
Patient temperature: 37
Patient temperature: 37
Patient temperature: 37
Patient temperature: 37
Patient temperature: 37
Patient temperature: 37
Patient temperature: 98.7
RATE: 14
RATE: 14
RATE: 14
RATE: 14
RATE: 15
RATE: 17
RATE: 17
RATE: 17
RATE: 17
RATE: 17
RATE: 18
RATE: 18
TCO2: 23.2
TCO2: 25.1
TCO2: 26.2
TCO2: 28.2
TCO2: 28.7
TCO2: 29.3
TCO2: 29.4
TCO2: 29.4
TCO2: 31
TCO2: 31.9
TCO2: 32.5
TCO2: 32.8
TCO2: 34
pCO2 arterial: 26.4 — ABNORMAL LOW
pCO2 arterial: 33.7 — ABNORMAL LOW
pCO2 arterial: 40
pCO2 arterial: 45.3 — ABNORMAL HIGH
pCO2 arterial: 46.1 — ABNORMAL HIGH
pCO2 arterial: 47.5 — ABNORMAL HIGH
pCO2 arterial: 49 — ABNORMAL HIGH
pCO2 arterial: 49.2 — ABNORMAL HIGH
pCO2 arterial: 49.3 — ABNORMAL HIGH
pH, Arterial: 7.328 — ABNORMAL LOW
pH, Arterial: 7.35
pH, Arterial: 7.4
pH, Arterial: 7.42 — ABNORMAL HIGH
pH, Arterial: 7.44 — ABNORMAL HIGH
pH, Arterial: 7.442 — ABNORMAL HIGH
pH, Arterial: 7.444 — ABNORMAL HIGH
pH, Arterial: 7.453 — ABNORMAL HIGH
pH, Arterial: 7.466 — ABNORMAL HIGH
pH, Arterial: 7.481 — ABNORMAL HIGH
pH, Arterial: 7.483 — ABNORMAL HIGH
pH, Arterial: 7.487 — ABNORMAL HIGH
pH, Arterial: 7.492 — ABNORMAL HIGH
pH, Arterial: 7.5 — ABNORMAL HIGH
pH, Arterial: 7.503 — ABNORMAL HIGH
pH, Arterial: 7.506 — ABNORMAL HIGH
pH, Arterial: 7.507 — ABNORMAL HIGH
pH, Arterial: 7.517 — ABNORMAL HIGH
pH, Arterial: 7.533 — ABNORMAL HIGH
pO2, Arterial: 52.6 — ABNORMAL LOW
pO2, Arterial: 58.3 — ABNORMAL LOW
pO2, Arterial: 59.4 — ABNORMAL LOW
pO2, Arterial: 61.3 — ABNORMAL LOW
pO2, Arterial: 72.2 — ABNORMAL LOW
pO2, Arterial: 73 — ABNORMAL LOW
pO2, Arterial: 73 — ABNORMAL LOW
pO2, Arterial: 80.3

## 2011-09-04 LAB — BASIC METABOLIC PANEL
BUN: 25 — ABNORMAL HIGH
BUN: 27 — ABNORMAL HIGH
BUN: 32 — ABNORMAL HIGH
CO2: 23
CO2: 38 — ABNORMAL HIGH
Calcium: 6.6 — ABNORMAL LOW
Calcium: 7.1 — ABNORMAL LOW
Calcium: 7.3 — ABNORMAL LOW
Calcium: 7.4 — ABNORMAL LOW
Calcium: 7.7 — ABNORMAL LOW
Calcium: 9.2
Chloride: 108
Chloride: 91 — ABNORMAL LOW
Creatinine, Ser: 0.73
Creatinine, Ser: 0.83
Creatinine, Ser: 0.96
Creatinine, Ser: 0.96
Creatinine, Ser: 1.6 — ABNORMAL HIGH
Creatinine, Ser: 1.89 — ABNORMAL HIGH
GFR calc Af Amer: 31 — ABNORMAL LOW
GFR calc Af Amer: 37 — ABNORMAL LOW
GFR calc Af Amer: 54 — ABNORMAL LOW
GFR calc Af Amer: >60
GFR calc Af Amer: >60
GFR calc non Af Amer: 31 — ABNORMAL LOW
GFR calc non Af Amer: 45 — ABNORMAL LOW
GFR calc non Af Amer: 56 — ABNORMAL LOW
GFR calc non Af Amer: >60
Glucose, Bld: 107 — ABNORMAL HIGH
Glucose, Bld: 96
Sodium: 137
Sodium: 138
Sodium: 140

## 2011-09-04 LAB — COMPREHENSIVE METABOLIC PANEL
ALT: 22
AST: 16
AST: 20
Albumin: 2.6 — ABNORMAL LOW
Albumin: 2.6 — ABNORMAL LOW
Alkaline Phosphatase: 69
Alkaline Phosphatase: 82
BUN: 33 — ABNORMAL HIGH
BUN: 34 — ABNORMAL HIGH
BUN: 34 — ABNORMAL HIGH
CO2: 36 — ABNORMAL HIGH
CO2: 37 — ABNORMAL HIGH
Calcium: 6.4 — CL
Calcium: 6.7 — ABNORMAL LOW
Calcium: 7.1 — ABNORMAL LOW
Chloride: 103
Creatinine, Ser: 0.74
Creatinine, Ser: 0.8
Creatinine, Ser: 0.87
Creatinine, Ser: 0.89
GFR calc Af Amer: >60
GFR calc Af Amer: >60
GFR calc non Af Amer: >60
GFR calc non Af Amer: >60
Glucose, Bld: 158 — ABNORMAL HIGH
Glucose, Bld: 99
Potassium: 3.5
Potassium: 4.4
Total Bilirubin: 0.6
Total Protein: 4.8 — ABNORMAL LOW
Total Protein: 4.9 — ABNORMAL LOW
Total Protein: 5.5 — ABNORMAL LOW

## 2011-09-04 LAB — POCT CARDIAC MARKERS
CKMB, poc: 4.1
Myoglobin, poc: >500

## 2011-09-04 LAB — CULTURE, BLOOD (ROUTINE X 2)

## 2011-09-04 LAB — URINALYSIS, MICROSCOPIC ONLY
Ketones, ur: NEGATIVE
Nitrite: POSITIVE — AB
Protein, ur: 100 — AB
Urobilinogen, UA: 0.2
pH: 6

## 2011-09-04 LAB — CBC
HCT: 34.4 — ABNORMAL LOW
HCT: 34.4 — ABNORMAL LOW
HCT: 35 — ABNORMAL LOW
Hemoglobin: 11.6 — ABNORMAL LOW
Hemoglobin: 11.7 — ABNORMAL LOW
Hemoglobin: 11.9 — ABNORMAL LOW
Hemoglobin: 12.1
Hemoglobin: 12.3
MCHC: 33.5
MCHC: 33.8
MCHC: 33.9
MCHC: 34
MCHC: 34.2
MCV: 93.4
MCV: 94
MCV: 94.3
MCV: 94.9
MCV: 95.1
Platelets: 165
Platelets: 182
Platelets: 216
Platelets: 242
Platelets: 257
RBC: 3.63 — ABNORMAL LOW
RBC: 3.65 — ABNORMAL LOW
RBC: 3.7 — ABNORMAL LOW
RBC: 3.76 — ABNORMAL LOW
RBC: 3.87
RBC: 4.34
RDW: 13.8
RDW: 14
RDW: 14
RDW: 14
RDW: 14
RDW: 14.2 — ABNORMAL HIGH
WBC: 19 — ABNORMAL HIGH
WBC: 21.3 — ABNORMAL HIGH
WBC: 23 — ABNORMAL HIGH
WBC: 23.4 — ABNORMAL HIGH
WBC: 24.3 — ABNORMAL HIGH
WBC: 30.2 — ABNORMAL HIGH

## 2011-09-04 LAB — CULTURE, RESPIRATORY W GRAM STAIN
Culture: NO GROWTH
Gram Stain: NONE SEEN

## 2011-09-04 LAB — MAGNESIUM: Magnesium: 2.1

## 2011-09-04 LAB — LIPASE, BLOOD: Lipase: <10 — ABNORMAL LOW

## 2011-09-04 LAB — PROTIME-INR: INR: 1.6 — ABNORMAL HIGH

## 2011-09-09 ENCOUNTER — Other Ambulatory Visit (INDEPENDENT_AMBULATORY_CARE_PROVIDER_SITE_OTHER): Payer: Self-pay | Admitting: *Deleted

## 2011-09-09 DIAGNOSIS — N824 Other female intestinal-genital tract fistulae: Secondary | ICD-10-CM

## 2011-09-16 MED ORDER — SODIUM CHLORIDE 0.45 % IV SOLN
Freq: Once | INTRAVENOUS | Status: AC
  Administered 2011-09-17: 08:00:00 via INTRAVENOUS

## 2011-09-17 ENCOUNTER — Ambulatory Visit (HOSPITAL_COMMUNITY)
Admission: RE | Admit: 2011-09-17 | Discharge: 2011-09-17 | Disposition: A | Discharge status: Home / Self Care | Payer: Medicare Other | Source: Ambulatory Visit | Attending: Internal Medicine | Admitting: Internal Medicine

## 2011-09-17 ENCOUNTER — Encounter (HOSPITAL_COMMUNITY)
Admission: RE | Disposition: A | Discharge status: Home / Self Care | Payer: Self-pay | Source: Ambulatory Visit | Attending: Internal Medicine

## 2011-09-17 ENCOUNTER — Encounter (HOSPITAL_COMMUNITY): Payer: Self-pay

## 2011-09-17 DIAGNOSIS — R933 Abnormal findings on diagnostic imaging of other parts of digestive tract: Secondary | ICD-10-CM

## 2011-09-17 DIAGNOSIS — N824 Other female intestinal-genital tract fistulae: Secondary | ICD-10-CM

## 2011-09-17 DIAGNOSIS — E119 Type 2 diabetes mellitus without complications: Secondary | ICD-10-CM | POA: Insufficient documentation

## 2011-09-17 HISTORY — PX: FLEXIBLE SIGMOIDOSCOPY: SHX5431

## 2011-09-17 SURGERY — SIGMOIDOSCOPY, FLEXIBLE
Anesthesia: Moderate Sedation

## 2011-09-17 MED ORDER — MIDAZOLAM HCL 5 MG/5ML IJ SOLN
INTRAMUSCULAR | Status: AC
  Filled 2011-09-17: qty 10

## 2011-09-17 MED ORDER — MEPERIDINE HCL 50 MG/ML IJ SOLN
INTRAMUSCULAR | Status: AC
  Filled 2011-09-17: qty 1

## 2011-09-17 NOTE — Op Note (Addendum)
FLEXIBLE SIGMOIDOSCOPY  PROCEDURE REPORT  PATIENT:  [name]  MR#:  [id number] Birthdate:  February 07, 1923, 77 y.o., female Endoscopist:  Dr. Malissa Hippo, MD Referred By:  Dr. Oneal Deputy. [name], M.D. Procedure Date: 09/17/2011  Procedure:   Flexible sigmoidoscopy.  Indications:  Patient is 77 year old female with intermittent diarrhea with abdominopelvic CT revealing air in the vagina and concern for rectovaginal fistula. She is undergoing diagnostic flexible sigmoidoscopy.  Informed Consent: Procedure and risks were reviewed with the patient and informed consent was obtained. Medications: None  Description of procedure: Patient was placed in left lateral recumbent position and rectal examination performed. On external inspection she had 2 large skin tags posteriorly away from the anal orifice. Digital exam was normal. Entex medial colonoscope was placed the rectum and advanced to the distal sigmoid colon. She had a lot of formed stool therefore was not an extended exam. Mucosa of the distal sigmoid and rectum was examined on the way out and scope was also retroflexed to examine anorectal junction.  Findings:   Normal mucosa distal sigmoid rectosigmoid junction as well as rectum. No abnormality noted on retroflex view. She had large amount of formed stool in the sigmoid colon and some in the rectum.  Therapeutic/Diagnostic Maneuvers Performed:  None  Complications:  None    Impression:  Normal limited flexible sigmoidoscopy. No mucosal abnormalities noted in the rectum or distal sigmoid to suggest colovaginal fistula. Large amount of formed stool suggesting poor evacuation.  Recommendations:  High fiber diet. Colace 2 tablets daily at bedtime. No further workup unless she has new symptoms.  REHMAN,NAJEEB U  09/17/2011 8:57 AM  CC: Dr. Fredirick Maudlin, MD & Dr. Bonnetta Barry ref. provider found     Patient advised to discontinue dicyclomine.

## 2011-09-17 NOTE — H&P (Signed)
[name] is an 77 y.o. female.   Chief Complaint: Patient is here for diagnostic flexible sigmoidoscopy. HPI: Patient is 77 year old Caucasian female who has intermittent diarrhea. She had CT few months ago which revealed air in her vagina and colovaginal or rectovaginal fistula was suspected. He has intermittent vaginal discharge but it is not foul-smelling. She has a good appetite and her weight has been stable. Issues last colonoscopy was 10 years ago. Family history is negative for colorectal carcinoma to  Past Medical History  Diagnosis Date  . Fistula     COLOVAGINAL  . Diverticulitis   . Other chronic cystitis   . Dysuria   . Incomplete bladder emptying   . Nocturia   . Postmenopausal atrophic vaginitis   . Other specified symptom associated with female genital organs   . Anxiety state, unspecified   . Personal history of arthritis   . Unspecified asthma   . Chronic airway obstruction, not elsewhere classified   . Other persistent mental disorders due to conditions classified elsewhere   . Depressive disorder, not elsewhere classified   . Type II or unspecified type diabetes mellitus without mention of complication, not stated as uncontrolled   . Esophageal reflux   . Diaphragmatic hernia without mention of obstruction or gangrene   . Insomnia, unspecified   . Other chronic cystitis   . Postmenopausal atrophic vaginitis   . Dysuria     Past Surgical History  Procedure Date  . Appendectomy   . Hernia repair   . Cataract surgery   . Nissen fundoplication   . Hemorrhoid surgery   . Cystoscopy   . Left kidney sturgery for kidney stones     History reviewed. No pertinent family history. Social History:  reports that she has never smoked. She does not have any smokeless tobacco history on file. She reports that she does not drink alcohol or use illicit drugs.  Allergies:  Allergies  Allergen Reactions  . Ciprocin-Fluocin-Procin (Fluocinolone Acetonide)   .  Nitrofurantoin   . Oxybutynin   . Quinine Sulfate   . Sulfa Antibiotics     Medications Prior to Admission  Medication Dose Route Frequency Provider Last Rate Last Dose  . 0.45 % sodium chloride infusion   Intravenous Once Malissa Hippo, MD 20 mL/hr at 09/17/11 0803    . meperidine (DEMEROL) 50 MG/ML injection           . midazolam (VERSED) 5 MG/5ML injection            Medications Prior to Admission  Medication Sig Dispense Refill  . buPROPion (WELLBUTRIN SR) 150 MG 12 hr tablet Take 150 mg by mouth daily.        . Calcium Carbonate-Vitamin D (CALCIUM + D PO) Take 1 tablet by mouth daily.        Marland Kitchen dicyclomine (BENTYL) 20 MG tablet Take 20 mg by mouth 3 (three) times daily.        Marland Kitchen glipiZIDE (GLUCOTROL XL) 10 MG 24 hr tablet Take 10 mg by mouth daily.        . Omega-3 Fatty Acids (FISH OIL) 1200 MG CAPS Take 1 capsule by mouth daily.        . Potassium Gluconate 550 MG TABS Take 1 tablet by mouth daily.        Marland Kitchen buPROPion (WELLBUTRIN SR) 100 MG 12 hr tablet Take by mouth 2 (two) times daily.        Marland Kitchen donepezil (ARICEPT) 10 MG tablet  Take 10 mg by mouth at bedtime as needed.        Marland Kitchen estradiol (ESTRACE) 0.1 MG/GM vaginal cream Place 2 g vaginally daily.        . Meth-Hyo-M Bl-Na Phos-Ph Sal (URIBEL) 118 MG CAPS Take by mouth.       . metroNIDAZOLE (METROCREAM) 0.75 % cream Apply topically 2 (two) times daily.          No results found for this or any previous visit (from the past 48 hour(s)). No results found.  Review of Systems  Constitutional: Negative for weight loss.  Gastrointestinal: Negative for abdominal pain, constipation, blood in stool and melena. Vomiting: intermittent diarrhea.    Blood pressure 134/68, pulse 88, temperature 98.4 F (36.9 C), temperature source Oral, resp. rate 18, height 5\' 5"  (1.651 m), weight 135 lb (61.236 kg), SpO2 93.00%. Physical Exam  Constitutional: She appears well-developed and well-nourished.  HENT:  Mouth/Throat: Oropharynx is clear  and moist.  Eyes: Conjunctivae are normal. No scleral icterus.  Neck: No thyromegaly present.  Cardiovascular: Normal rate, regular rhythm and normal heart sounds.   No murmur heard. Respiratory: Effort normal and breath sounds normal.  GI: Soft. She exhibits no distension and no mass. There is no tenderness.  Musculoskeletal: She exhibits no edema.  Lymphadenopathy:    She has no cervical adenopathy.  Neurological: She is alert.  Skin: Skin is warm and dry.     Assessment/Plan Abnormal abdominopelvic CT. Diagnostic flexible sigmoidoscopy to rule out rectovaginal colovaginal fistula.  Collan Schoenfeld U 09/17/2011, 8:47 AM

## 2011-09-24 ENCOUNTER — Encounter (HOSPITAL_COMMUNITY): Payer: Self-pay | Admitting: Internal Medicine

## 2011-12-19 ENCOUNTER — Ambulatory Visit: Payer: Medicare Other | Admitting: Urology

## 2012-03-26 ENCOUNTER — Ambulatory Visit: Payer: Medicare Other | Admitting: Urology

## 2012-07-22 ENCOUNTER — Ambulatory Visit (HOSPITAL_COMMUNITY)
Admission: RE | Admit: 2012-07-22 | Discharge: 2012-07-22 | Disposition: A | Discharge status: Home / Self Care | Payer: Medicare Other | Source: Ambulatory Visit | Attending: Urology | Admitting: Urology

## 2012-07-22 ENCOUNTER — Other Ambulatory Visit: Payer: Self-pay | Admitting: Urology

## 2012-07-22 DIAGNOSIS — R109 Unspecified abdominal pain: Secondary | ICD-10-CM | POA: Insufficient documentation

## 2012-07-22 DIAGNOSIS — N209 Urinary calculus, unspecified: Secondary | ICD-10-CM | POA: Insufficient documentation

## 2012-07-23 ENCOUNTER — Ambulatory Visit (INDEPENDENT_AMBULATORY_CARE_PROVIDER_SITE_OTHER): Payer: Medicare Other | Admitting: Urology

## 2012-07-23 DIAGNOSIS — N3941 Urge incontinence: Secondary | ICD-10-CM

## 2012-07-23 DIAGNOSIS — N3020 Other chronic cystitis without hematuria: Secondary | ICD-10-CM

## 2012-07-23 DIAGNOSIS — Z87442 Personal history of urinary calculi: Secondary | ICD-10-CM

## 2012-08-20 ENCOUNTER — Other Ambulatory Visit: Payer: Self-pay | Admitting: Urology

## 2012-08-20 DIAGNOSIS — N200 Calculus of kidney: Secondary | ICD-10-CM

## 2012-08-26 ENCOUNTER — Ambulatory Visit (HOSPITAL_COMMUNITY)
Admission: RE | Admit: 2012-08-26 | Discharge: 2012-08-26 | Disposition: A | Discharge status: Home / Self Care | Payer: Medicare Other | Source: Ambulatory Visit | Attending: Urology | Admitting: Urology

## 2012-08-26 DIAGNOSIS — N200 Calculus of kidney: Secondary | ICD-10-CM | POA: Insufficient documentation

## 2012-09-17 ENCOUNTER — Ambulatory Visit (INDEPENDENT_AMBULATORY_CARE_PROVIDER_SITE_OTHER): Payer: Medicare Other | Admitting: Urology

## 2012-09-17 DIAGNOSIS — N3941 Urge incontinence: Secondary | ICD-10-CM

## 2012-09-17 DIAGNOSIS — N3020 Other chronic cystitis without hematuria: Secondary | ICD-10-CM

## 2012-09-17 DIAGNOSIS — Z87442 Personal history of urinary calculi: Secondary | ICD-10-CM

## 2012-09-17 DIAGNOSIS — N200 Calculus of kidney: Secondary | ICD-10-CM

## 2013-01-11 ENCOUNTER — Other Ambulatory Visit: Payer: Self-pay | Admitting: Urology

## 2013-01-11 DIAGNOSIS — N200 Calculus of kidney: Secondary | ICD-10-CM

## 2013-03-07 ENCOUNTER — Other Ambulatory Visit: Payer: Self-pay | Admitting: Urology

## 2013-03-07 ENCOUNTER — Ambulatory Visit (HOSPITAL_COMMUNITY)
Admission: RE | Admit: 2013-03-07 | Discharge: 2013-03-07 | Disposition: A | Discharge status: Home / Self Care | Payer: Medicare Other | Source: Ambulatory Visit | Attending: Urology | Admitting: Urology

## 2013-03-07 DIAGNOSIS — R109 Unspecified abdominal pain: Secondary | ICD-10-CM | POA: Insufficient documentation

## 2013-03-07 DIAGNOSIS — N200 Calculus of kidney: Secondary | ICD-10-CM | POA: Insufficient documentation

## 2013-03-18 ENCOUNTER — Ambulatory Visit (INDEPENDENT_AMBULATORY_CARE_PROVIDER_SITE_OTHER): Payer: Medicare Other | Admitting: Urology

## 2013-03-18 DIAGNOSIS — N200 Calculus of kidney: Secondary | ICD-10-CM

## 2013-03-18 DIAGNOSIS — N3941 Urge incontinence: Secondary | ICD-10-CM

## 2013-08-12 ENCOUNTER — Other Ambulatory Visit: Payer: Self-pay | Admitting: Urology

## 2013-08-12 DIAGNOSIS — N200 Calculus of kidney: Secondary | ICD-10-CM

## 2013-09-09 ENCOUNTER — Other Ambulatory Visit: Payer: Self-pay | Admitting: Urology

## 2013-09-09 ENCOUNTER — Ambulatory Visit (HOSPITAL_COMMUNITY)
Admission: RE | Admit: 2013-09-09 | Discharge: 2013-09-09 | Disposition: A | Discharge status: Home / Self Care | Payer: Medicare Other | Source: Ambulatory Visit | Attending: Urology | Admitting: Urology

## 2013-09-09 DIAGNOSIS — N200 Calculus of kidney: Secondary | ICD-10-CM

## 2013-09-16 ENCOUNTER — Encounter (INDEPENDENT_AMBULATORY_CARE_PROVIDER_SITE_OTHER): Payer: Self-pay

## 2013-09-16 ENCOUNTER — Ambulatory Visit (INDEPENDENT_AMBULATORY_CARE_PROVIDER_SITE_OTHER): Payer: Medicare Other | Admitting: Urology

## 2013-09-16 DIAGNOSIS — N3941 Urge incontinence: Secondary | ICD-10-CM

## 2013-09-16 DIAGNOSIS — N200 Calculus of kidney: Secondary | ICD-10-CM

## 2014-09-05 ENCOUNTER — Other Ambulatory Visit: Payer: Self-pay | Admitting: Urology

## 2014-09-05 DIAGNOSIS — N200 Calculus of kidney: Secondary | ICD-10-CM

## 2014-09-07 ENCOUNTER — Other Ambulatory Visit (HOSPITAL_COMMUNITY): Payer: Medicare Other

## 2014-09-08 ENCOUNTER — Ambulatory Visit (HOSPITAL_COMMUNITY)
Admission: RE | Admit: 2014-09-08 | Discharge: 2014-09-08 | Disposition: A | Discharge status: Home / Self Care | Payer: Medicare Other | Source: Ambulatory Visit | Attending: Urology | Admitting: Urology

## 2014-09-08 DIAGNOSIS — N200 Calculus of kidney: Secondary | ICD-10-CM | POA: Diagnosis present

## 2014-09-15 ENCOUNTER — Ambulatory Visit: Payer: Medicare Other | Admitting: Urology

## 2015-03-27 ENCOUNTER — Emergency Department (HOSPITAL_COMMUNITY)
Admission: EM | Admit: 2015-03-27 | Discharge: 2015-03-28 | Disposition: A | Discharge status: Other Acute Inpatient Hospital | Payer: Medicare Other | Attending: Emergency Medicine | Admitting: Emergency Medicine

## 2015-03-27 ENCOUNTER — Encounter (HOSPITAL_COMMUNITY): Payer: Self-pay

## 2015-03-27 ENCOUNTER — Emergency Department (HOSPITAL_COMMUNITY): Payer: Medicare Other

## 2015-03-27 DIAGNOSIS — Z8742 Personal history of other diseases of the female genital tract: Secondary | ICD-10-CM | POA: Insufficient documentation

## 2015-03-27 DIAGNOSIS — E119 Type 2 diabetes mellitus without complications: Secondary | ICD-10-CM | POA: Diagnosis not present

## 2015-03-27 DIAGNOSIS — Z8719 Personal history of other diseases of the digestive system: Secondary | ICD-10-CM | POA: Insufficient documentation

## 2015-03-27 DIAGNOSIS — R05 Cough: Secondary | ICD-10-CM | POA: Diagnosis not present

## 2015-03-27 DIAGNOSIS — F0391 Unspecified dementia with behavioral disturbance: Secondary | ICD-10-CM

## 2015-03-27 DIAGNOSIS — Z792 Long term (current) use of antibiotics: Secondary | ICD-10-CM | POA: Insufficient documentation

## 2015-03-27 DIAGNOSIS — F0390 Unspecified dementia without behavioral disturbance: Secondary | ICD-10-CM | POA: Diagnosis not present

## 2015-03-27 DIAGNOSIS — F419 Anxiety disorder, unspecified: Secondary | ICD-10-CM | POA: Insufficient documentation

## 2015-03-27 DIAGNOSIS — Z87448 Personal history of other diseases of urinary system: Secondary | ICD-10-CM | POA: Insufficient documentation

## 2015-03-27 DIAGNOSIS — Z872 Personal history of diseases of the skin and subcutaneous tissue: Secondary | ICD-10-CM | POA: Insufficient documentation

## 2015-03-27 DIAGNOSIS — Z8669 Personal history of other diseases of the nervous system and sense organs: Secondary | ICD-10-CM | POA: Insufficient documentation

## 2015-03-27 DIAGNOSIS — J449 Chronic obstructive pulmonary disease, unspecified: Secondary | ICD-10-CM | POA: Diagnosis not present

## 2015-03-27 DIAGNOSIS — R451 Restlessness and agitation: Secondary | ICD-10-CM

## 2015-03-27 DIAGNOSIS — R059 Cough, unspecified: Secondary | ICD-10-CM

## 2015-03-27 DIAGNOSIS — F1310 Sedative, hypnotic or anxiolytic abuse, uncomplicated: Secondary | ICD-10-CM | POA: Insufficient documentation

## 2015-03-27 DIAGNOSIS — Z008 Encounter for other general examination: Secondary | ICD-10-CM | POA: Diagnosis present

## 2015-03-27 DIAGNOSIS — Z79899 Other long term (current) drug therapy: Secondary | ICD-10-CM | POA: Insufficient documentation

## 2015-03-27 HISTORY — DX: Unspecified dementia, unspecified severity, without behavioral disturbance, psychotic disturbance, mood disturbance, and anxiety: F03.90

## 2015-03-27 LAB — CBC WITH DIFFERENTIAL/PLATELET
BASOS ABS: 0 10*3/uL (ref 0.0–0.1)
BASOS PCT: 0 % (ref 0–1)
EOS ABS: 0.1 10*3/uL (ref 0.0–0.7)
EOS PCT: 1 % (ref 0–5)
HEMATOCRIT: 46.1 % — AB (ref 36.0–46.0)
Hemoglobin: 15 g/dL (ref 12.0–15.0)
LYMPHS ABS: 2.1 10*3/uL (ref 0.7–4.0)
Lymphocytes Relative: 28 % (ref 12–46)
MCH: 31.9 pg (ref 26.0–34.0)
MCHC: 32.5 g/dL (ref 30.0–36.0)
MCV: 98.1 fL (ref 78.0–100.0)
Monocytes Absolute: 1.5 10*3/uL — ABNORMAL HIGH (ref 0.1–1.0)
Monocytes Relative: 20 % — ABNORMAL HIGH (ref 3–12)
NEUTROS PCT: 51 % (ref 43–77)
Neutro Abs: 3.6 10*3/uL (ref 1.7–7.7)
PLATELETS: 205 10*3/uL (ref 150–400)
RBC: 4.7 MIL/uL (ref 3.87–5.11)
RDW: 14.4 % (ref 11.5–15.5)
WBC: 7.3 10*3/uL (ref 4.0–10.5)

## 2015-03-27 LAB — BASIC METABOLIC PANEL
ANION GAP: 9 (ref 5–15)
BUN: 12 mg/dL (ref 6–20)
CALCIUM: 8.8 mg/dL — AB (ref 8.9–10.3)
CO2: 24 mmol/L (ref 22–32)
Chloride: 107 mmol/L (ref 101–111)
Creatinine, Ser: 0.79 mg/dL (ref 0.44–1.00)
GLUCOSE: 85 mg/dL (ref 70–99)
POTASSIUM: 4 mmol/L (ref 3.5–5.1)
SODIUM: 140 mmol/L (ref 135–145)

## 2015-03-27 LAB — URINALYSIS, ROUTINE W REFLEX MICROSCOPIC
BILIRUBIN URINE: NEGATIVE
Glucose, UA: NEGATIVE mg/dL
Hgb urine dipstick: NEGATIVE
KETONES UR: NEGATIVE mg/dL
NITRITE: NEGATIVE
PROTEIN: NEGATIVE mg/dL
Specific Gravity, Urine: 1.02 (ref 1.005–1.030)
Urobilinogen, UA: 0.2 mg/dL (ref 0.0–1.0)
pH: 5.5 (ref 5.0–8.0)

## 2015-03-27 LAB — RAPID URINE DRUG SCREEN, HOSP PERFORMED
AMPHETAMINES: NOT DETECTED
BENZODIAZEPINES: POSITIVE — AB
Barbiturates: NOT DETECTED
COCAINE: NOT DETECTED
OPIATES: NOT DETECTED
TETRAHYDROCANNABINOL: NOT DETECTED

## 2015-03-27 LAB — URINE MICROSCOPIC-ADD ON

## 2015-03-27 LAB — ETHANOL: Alcohol, Ethyl (B): <5 mg/dL (ref <5)

## 2015-03-27 NOTE — ED Notes (Signed)
Security called to wand pt and will site with pt until room available.

## 2015-03-27 NOTE — ED Notes (Signed)
Family left phone numbers to be called with disposition. Darrick Grinderim Penninger (home) [phone]                   (cell)    [phone] Maryjo RochesterLori Brun  (cell)    [phone]

## 2015-03-27 NOTE — BH Assessment (Addendum)
Tele Assessment Note   [name] is an 81 y.o. female.  -Clinician talked to Dr. Jodi Mourning about need for TTS.  He said that Dr. [name] (primary care physician) had sent patient to [facility] for psych evaluation.  She had a UA done but it is unclear if she has a UTI.  Dr. Jodi Mourning said that the sample was cloudy.  Patient reportedly has been increasingly aggressive at home.  Pt has a caregiver that stays in the home.  Patient denies any current SI, HI or A/V hallucinations.  It is documented that daughter reported that patient said she wished she were dead as they were heading to the doctor's office.  Patient has no previous history of suicide attempts.  Patient says that she lives by herself.    Patient gave permission for clinician to call Signa Cheek, her son.  Marcial Pacas is also her healthcare POA.  She also has a living will he says.  Marcial Pacas said that patient has been increasingly agitated over the last few weeks.  She had a person that came in and helped her for 8 hours per day for about a year.  A week ago Sunday (04/24) patient started having a family friend to stay in the home with her 24/7.  This caregiver was experienced with working with the elderly.  Clinician was told by son that patient had said to the caregiver that she did not need her there all the time, that she could manage by herself.  This change in having someone in her home with her all the time could be contributing to the agitation.  Patient today was very verbally aggressive towards the caregiver and was "not acting like herself" according to son.  At that time caregiver contacted patient's daughters and they went to the doctor's [name]) office.  Patient was sent to [facility].  Patient is not able to take care of herself without assistance.  Marcial Pacas said that family has looked into whether patient needs to go into a facility but they have not made a decision yet.  Patient has made statements about her quality of life not being  good, according to [place].  -Clinician talked to Donell Sievert, PA who said that if it is a UTI then it needed to be treated.  If she does not have a UTI then she may need inpatient gero psych care.  Clinician talked to Dr. Jodi Mourning who said he would order a urine specimen to check on whether there is a UTI present.  Clinician called son and let him know.  -Dr. Jodi Mourning called clinician back and let him know that the UA came up negative for UTI.  Therefore, patient will be referred to a gero psych facility.    Axis I: Dementia Axis II: Deferred Axis III:  Past Medical History  Diagnosis Date  . Fistula     COLOVAGINAL  . Diverticulitis   . Other chronic cystitis   . Dysuria   . Incomplete bladder emptying   . Nocturia   . Postmenopausal atrophic vaginitis   . Other specified symptom associated with female genital organs   . Anxiety state, unspecified   . Personal history of arthritis   . Unspecified asthma(493.90)   . Chronic airway obstruction, not elsewhere classified   . Other persistent mental disorders due to conditions classified elsewhere   . Depressive disorder, not elsewhere classified   . Type II or unspecified type diabetes mellitus without mention of complication, not stated as uncontrolled   .  Esophageal reflux   . Diaphragmatic hernia without mention of obstruction or gangrene   . Insomnia, unspecified   . Other chronic cystitis   . Postmenopausal atrophic vaginitis   . Dysuria   . Dementia    Axis IV: problems related to social environment Axis V: 31-40 impairment in reality testing  Past Medical History:  Past Medical History  Diagnosis Date  . Fistula     COLOVAGINAL  . Diverticulitis   . Other chronic cystitis   . Dysuria   . Incomplete bladder emptying   . Nocturia   . Postmenopausal atrophic vaginitis   . Other specified symptom associated with female genital organs   . Anxiety state, unspecified   . Personal history of arthritis   . Unspecified  asthma(493.90)   . Chronic airway obstruction, not elsewhere classified   . Other persistent mental disorders due to conditions classified elsewhere   . Depressive disorder, not elsewhere classified   . Type II or unspecified type diabetes mellitus without mention of complication, not stated as uncontrolled   . Esophageal reflux   . Diaphragmatic hernia without mention of obstruction or gangrene   . Insomnia, unspecified   . Other chronic cystitis   . Postmenopausal atrophic vaginitis   . Dysuria   . Dementia     Past Surgical History  Procedure Laterality Date  . Appendectomy    . Hernia repair    . Cataract surgery    . Nissen fundoplication    . Hemorrhoid surgery    . Cystoscopy    . Left kidney sturgery for kidney stones    . Flexible sigmoidoscopy  09/17/2011    Procedure: FLEXIBLE SIGMOIDOSCOPY;  Surgeon: Malissa HippoNajeeb U Rehman, MD;  [facility];  Service: Endoscopy;  Laterality: N/A;  8:30 am    Family History: No family history on file.  Social History:  reports that she has never smoked. She does not have any smokeless tobacco history on file. She reports that she does not drink alcohol or use illicit drugs.  Additional Social History:  Alcohol / Drug Use Pain Medications: See PTA medication list Prescriptions: See PTA medication list Over the Counter: See PTA medication list History of alcohol / drug use?: No history of alcohol / drug abuse  CIWA: CIWA-Ar BP: 127/67 mmHg Pulse Rate: 73 COWS:    PATIENT STRENGTHS: (choose at least two) Average or above average intelligence Supportive family/friends  Allergies:  Allergies  Allergen Reactions  . Ciprocin-Fluocin-Procin [Fluocinolone Acetonide]   . Nitrofurantoin   . Oxybutynin   . Quinine Sulfate   . Sulfa Antibiotics     Home Medications:  (Not in a hospital admission)  OB/GYN Status:  No LMP recorded. Patient is postmenopausal.  General Assessment Data Location of Assessment: [facility]  Assessment: In system Is this a Tele or Face-to-Face Assessment?: Tele Assessment Is this an Initial Assessment or a Re-assessment for this encounter?: Initial Assessment Marital status: Widowed Is patient pregnant?: No Pregnancy Status: No Living Arrangements: Alone, Other (Comment) (Does have a family friend that has been staying with her.) Can pt return to current living arrangement?:  (Family is unsure) Admission Status: Voluntary Is patient capable of signing voluntary admission?: Yes Referral Source: Self/Family/Friend Insurance type: BC/BS     Crisis Care Plan Living Arrangements: Alone, Other (Comment) (Does have a family friend that has been staying with her.) Name of Psychiatrist: None Name of Therapist: None  Education Status Is patient currently in school?: No Highest grade  of school patient has completed: High School graduate  Risk to self with the past 6 months Suicidal Ideation: No Has patient been a risk to self within the past 6 months prior to admission? : No Suicidal Intent: No Has patient had any suicidal intent within the past 6 months prior to admission? : Yes Is patient at risk for suicide?: Yes (Pt had verbalized to daughter she wanted to kill herself on ) Suicidal Plan?: No Has patient had any suicidal plan within the past 6 months prior to admission? : No Access to Means: No What has been your use of drugs/alcohol within the last 12 months?: N/A Previous Attempts/Gestures: No How many times?: 0 Other Self Harm Risks: None Triggers for Past Attempts: None known Intentional Self Injurious Behavior: None Family Suicide History: No Recent stressful life event(s): Other (Comment) (Pt has had no recent events out of the ordinary.) Persecutory voices/beliefs?: No Depression: Yes Depression Symptoms: Loss of interest in usual pleasures (Has expressed some concern over her health.) Substance abuse history and/or treatment for substance abuse?: No Suicide  prevention information given to non-admitted patients: Not applicable  Risk to Others within the past 6 months Homicidal Ideation: No Does patient have any lifetime risk of violence toward others beyond the six months prior to admission? : No Thoughts of Harm to Others: No-Not Currently Present/Within Last 6 Months (Pt did not want the aid to be help her.) Current Homicidal Intent: No Current Homicidal Plan: No Access to Homicidal Means: No Identified Victim: No one History of harm to others?: No Assessment of Violence: None Noted Violent Behavior Description: None Does patient have access to weapons?: No Criminal Charges Pending?: No Does patient have a court date: No Is patient on probation?: No  Psychosis Hallucinations: None noted Delusions: None noted  Mental Status Report Appearance/Hygiene: In scrubs Eye Contact: Good Motor Activity: Freedom of movement, Unsteady (Will use her walker if needed.) Speech: Logical/coherent Level of Consciousness: Alert Mood: Pleasant Affect: Anxious Anxiety Level: Moderate (Has had more anxiety or nervousness over the last month.) Thought Processes: Coherent, Relevant Judgement: Unimpaired Orientation: Person, Place, Appropriate for developmental age Obsessive Compulsive Thoughts/Behaviors: None  Cognitive Functioning Concentration: Decreased Memory: Recent Impaired, Remote Impaired IQ: Average Insight: Poor Impulse Control: Poor Appetite: Good Weight Loss: 0 Weight Gain: 0 Sleep: No Change Total Hours of Sleep: 8 Vegetative Symptoms: None (Pt may be vegetative at times.)  ADLScreening [facility]) Patient's cognitive ability adequate to safely complete daily activities?: No Patient able to express need for assistance with ADLs?: Yes Independently performs ADLs?: No  Prior Inpatient Therapy Prior Inpatient Therapy: No Prior Therapy Dates: N/A Prior Therapy Facilty/Provider(s): N/A Reason for Treatment:  N/a  Prior Outpatient Therapy Prior Outpatient Therapy: No Prior Therapy Dates: None Prior Therapy Facilty/Provider(s): None Reason for Treatment: None Does patient have an ACCT team?: No Does patient have Intensive In-House Services?  : No Does patient have [facility] services? : No Does patient have P4CC services?: No  ADL Screening (condition at time of admission) Patient's cognitive ability adequate to safely complete daily activities?: No Is the patient deaf or have difficulty hearing?: Yes Does the patient have difficulty seeing, even when wearing glasses/contacts?: Yes Does the patient have difficulty concentrating, remembering, or making decisions?: Yes Patient able to express need for assistance with ADLs?: Yes Does the patient have difficulty dressing or bathing?: Yes Independently performs ADLs?: No Communication: Independent Dressing (OT): Needs assistance Is this a change from baseline?: Pre-admission baseline Grooming: Needs assistance  Is this a change from baseline?: Pre-admission baseline Feeding: Independent Bathing: Needs assistance Is this a change from baseline?: Pre-admission baseline Toileting: Appropriate for developmental age In/Out Bed: Independent with device (comment) Walks in Home: Independent with device (comment) Does the patient have difficulty walking or climbing stairs?: Yes Weakness of Legs: Both Weakness of Arms/Hands: Both       Abuse/Neglect Assessment (Assessment to be complete while patient is alone) Physical Abuse: Denies Verbal Abuse: Denies Sexual Abuse: Denies Exploitation of patient/patient's resources: Denies Self-Neglect: Denies     Merchant navy officer (For Healthcare) Does patient have an advance directive?: No, Yes Type of Advance Directive: Healthcare Power of [place], Living will (Son Shakeitha Umbaugh is Healthcare POA and there is a living will.) Copy of advanced directive(s) in chart?: No - copy requested    Additional  Information 1:1 In Past 12 Months?: Yes (One on one caregiver in the home.) CIRT Risk: No Elopement Risk: No Does patient have medical clearance?: Yes     Disposition:  Disposition Initial Assessment Completed for this Encounter: Yes Disposition of Patient: Other dispositions Other disposition(s): Other (Comment) (To be reviewed with PA)  Beatriz Stallion Ray 03/27/2015 11:05 PM

## 2015-03-27 NOTE — ED Provider Notes (Addendum)
CSN: [id number]     Arrival date & time 03/27/15  1658 History  This chart was scribed for Blane OharaJoshua Nahiem Dredge, MD by Richarda Overlieichard Holland, ED Scribe. This patient was seen in room [facility] and the patient's care was started 8:53 PM.   Chief Complaint  Patient presents with  . V70.1   The history is provided by the patient and a relative. No language interpreter was used.   HPI Comments: [name] is a 81 y.o. female with a history of DM, pneumonia and dementia who presents to the Emergency Department because, per nursing, "pt has recently been getting violent and combative over the past 3 weeks." Pt was seen by Dr. Juanetta GoslingHawkins today and was sent here for evaluation. Pt states that she lives by her self and says her family checks on her. She says that she is taking "pills for rumbling in my chest, like pneumonia". Pt reports no alleviating or exacerbating factors at this time. She denies CP, abdominal pain, vomiting or diarrhea.   Past Medical History  Diagnosis Date  . Fistula     COLOVAGINAL  . Diverticulitis   . Other chronic cystitis   . Dysuria   . Incomplete bladder emptying   . Nocturia   . Postmenopausal atrophic vaginitis   . Other specified symptom associated with female genital organs   . Anxiety state, unspecified   . Personal history of arthritis   . Unspecified asthma(493.90)   . Chronic airway obstruction, not elsewhere classified   . Other persistent mental disorders due to conditions classified elsewhere   . Depressive disorder, not elsewhere classified   . Type II or unspecified type diabetes mellitus without mention of complication, not stated as uncontrolled   . Esophageal reflux   . Diaphragmatic hernia without mention of obstruction or gangrene   . Insomnia, unspecified   . Other chronic cystitis   . Postmenopausal atrophic vaginitis   . Dysuria   . Dementia    Past Surgical History  Procedure Laterality Date  . Appendectomy    . Hernia repair    . Cataract  surgery    . Nissen fundoplication    . Hemorrhoid surgery    . Cystoscopy    . Left kidney sturgery for kidney stones    . Flexible sigmoidoscopy  09/17/2011    Procedure: FLEXIBLE SIGMOIDOSCOPY;  Surgeon: Malissa HippoNajeeb U Rehman, MD;  [facility];  Service: Endoscopy;  Laterality: N/A;  8:30 am   No family history on file. History  Substance Use Topics  . Smoking status: Never Smoker   . Smokeless tobacco: Not on file  . Alcohol Use: No   OB History    No data available     Review of Systems  Constitutional: Negative for fever and chills.  HENT: Positive for congestion.   Eyes: Negative for visual disturbance.  Respiratory: Negative for shortness of breath.   Cardiovascular: Negative for chest pain.  Gastrointestinal: Negative for vomiting, abdominal pain and diarrhea.  Genitourinary: Negative for dysuria and flank pain.  Musculoskeletal: Negative for back pain, neck pain and neck stiffness.  Skin: Negative for rash.  Neurological: Negative for light-headedness and headaches.  Psychiatric/Behavioral: Positive for agitation.  All other systems reviewed and are negative.   Allergies  Ciprocin-fluocin-procin; Nitrofurantoin; Oxybutynin; Quinine sulfate; and Sulfa antibiotics  Home Medications   Prior to Admission medications   Medication Sig Start Date End Date Taking? Authorizing Provider  ALPRAZolam Prudy Feeler(XANAX) 0.5 MG tablet Take 0.5 mg by mouth 3 (  three) times daily.   Yes Historical Provider, MD  dicyclomine (BENTYL) 20 MG tablet Take 1 tablet by mouth 3 (three) times daily. 02/26/15  Yes Historical Provider, MD  metFORMIN (GLUCOPHAGE) 500 MG tablet Take 500 mg by mouth daily with breakfast.   Yes Historical Provider, MD  sertraline (ZOLOFT) 100 MG tablet Take 50 mg by mouth daily.  02/26/15  Yes Historical Provider, MD  cephALEXin (KEFLEX) 500 MG capsule Take 1 capsule by mouth 3 (three) times daily. Starting 03/27/2015 x 10 days. 03/27/15   Historical Provider, MD   BP  127/67 mmHg  Pulse 73  Temp(Src) 98.5 F (36.9 C) (Oral)  Resp 18  Ht  (1.575 m)  Wt 138 lb (62.596 kg)  BMI 25.23 kg/m2  SpO2 93%   Physical Exam  Constitutional: She appears well-developed and well-nourished.  HENT:  Head: Normocephalic and atraumatic.  No facial drift.   Eyes: EOM are normal. Pupils are equal, round, and reactive to light. Right eye exhibits no discharge. Left eye exhibits no discharge.  Neck: Normal range of motion. Neck supple. Thyromegaly: no meningismus.  Cardiovascular: Normal rate, regular rhythm and normal heart sounds.   Pulmonary/Chest: Effort normal. No respiratory distress.  Mild congestion.   Abdominal: Soft. She exhibits no distension. There is no tenderness.  Neurological: She is alert.  Alert to self and city but did not know we were in the hospital. Pleasant dementia during exam. With repeat can complete neuro exam. No arm drift. Equal strength in all extremities. No swelling. Sensation intact to palpation. No facial droop.   Skin: Skin is warm and dry.  Nursing note and vitals reviewed.   ED Course  Procedures   DIAGNOSTIC STUDIES: Oxygen Saturation is 93% on RA, normal by my interpretation.    COORDINATION OF CARE: 9:00 PM Discussed treatment plan with pt at bedside and pt agreed to plan.  Labs Review Labs Reviewed  CBC WITH DIFFERENTIAL/PLATELET - Abnormal; Notable for the following:    HCT 46.1 (*)    Monocytes Relative 20 (*)    Monocytes Absolute 1.5 (*)    All other components within normal limits  BASIC METABOLIC PANEL - Abnormal; Notable for the following:    Calcium 8.8 (*)    All other components within normal limits  URINALYSIS, ROUTINE W REFLEX MICROSCOPIC - Abnormal; Notable for the following:    APPearance HAZY (*)    Leukocytes, UA LARGE (*)    All other components within normal limits  URINE RAPID DRUG SCREEN (HOSP PERFORMED) - Abnormal; Notable for the following:    Benzodiazepines POSITIVE (*)    All other  components within normal limits  URINE MICROSCOPIC-ADD ON - Abnormal; Notable for the following:    Squamous Epithelial / LPF MANY (*)    All other components within normal limits  URINE CULTURE  ETHANOL  URINALYSIS, ROUTINE W REFLEX MICROSCOPIC    Imaging Review Dg Chest Port 1 View  03/27/2015   CLINICAL DATA:  Altered mental status.  Vague chest complaints.  EXAM: PORTABLE CHEST - 1 VIEW  COMPARISON:  04/15/2011  FINDINGS: There is unchanged marked right hemidiaphragm elevation. The lungs are clear except for minor streaky opacities adjacent to the elevated right hemidiaphragm and these are unchanged. There is no large effusion. There is no pneumothorax. The pulmonary vasculature is normal.  IMPRESSION: Unchanged right hemidiaphragm elevation. No acute cardiopulmonary findings.   Electronically Signed   By: Ellery Plunk M.D.   On: 03/27/2015 21:25  EKG Interpretation None      MDM   Final diagnoses:  Cough  Agitation  Dementia, with behavioral disturbance   Patient brought into the emergency department for further evaluation of worsening dementia and agitation/combative recently. On exam the ER patient cooperative, mild intermittent agitation that's easily controlled. Concern for history the past 3 weeks and concern for safety of others around the patient in addition to the patient herself.  Behavior health assessment and plan for placement. Repeat urinalysis cath showed no sign of significant infection. No fever, normal vital signs.  The patients results and plan were reviewed and discussed.   Any x-rays performed were personally reviewed by myself.   Differential diagnosis were considered with the presenting HPI.  Medications  ALPRAZolam (XANAX) tablet 0.5 mg (not administered)  metFORMIN (GLUCOPHAGE) tablet 500 mg (not administered)  sertraline (ZOLOFT) tablet 50 mg (not administered)    Filed Vitals:   03/27/15 1706 03/27/15 2157  BP: 159/77 127/67  Pulse:  91 73  Temp: 98.1 F (36.7 C) 98.5 F (36.9 C)  TempSrc: Oral Oral  Resp: 24 18  Height:  (1.575 m)   Weight: 138 lb (62.596 kg)   SpO2: 93% 93%    Final diagnoses:  Cough  Agitation  Dementia, with behavioral disturbance    Final placement pending BH.   Blane Ohara, MD 03/28/15 1610  Blane Ohara, MD 03/28/15 [phone]

## 2015-03-27 NOTE — ED Notes (Signed)
MD at bedside. 

## 2015-03-27 NOTE — ED Notes (Signed)
Daughter reports pt has history of dementia and recently has been getting violent and combative over the past 3 weeks.  Saw Dr. Juanetta GoslingHawkins today and was sent here for eval.  Reports had UA performed by Dr. Juanetta GoslingHawkins and was told it was ok.  Daughter says Dr. Juanetta GoslingHawkins was sending the specimen for a culture.  Daughter says she and Dr. Juanetta GoslingHawkins are afraid pt is a danger to self and others.

## 2015-03-28 DIAGNOSIS — F0390 Unspecified dementia without behavioral disturbance: Secondary | ICD-10-CM

## 2015-03-28 LAB — URINALYSIS, ROUTINE W REFLEX MICROSCOPIC
Bilirubin Urine: NEGATIVE
GLUCOSE, UA: NEGATIVE mg/dL
Hgb urine dipstick: NEGATIVE
Ketones, ur: NEGATIVE mg/dL
LEUKOCYTES UA: NEGATIVE
Nitrite: NEGATIVE
PH: 6 (ref 5.0–8.0)
PROTEIN: NEGATIVE mg/dL
SPECIFIC GRAVITY, URINE: 1.015 (ref 1.005–1.030)
Urobilinogen, UA: 0.2 mg/dL (ref 0.0–1.0)

## 2015-03-28 LAB — CBG MONITORING, ED: Glucose-Capillary: 125 mg/dL — ABNORMAL HIGH (ref 70–99)

## 2015-03-28 MED ORDER — LORAZEPAM 2 MG/ML IJ SOLN
2.0000 mg | Freq: Once | INTRAMUSCULAR | Status: AC
  Administered 2015-03-28: 2 mg via INTRAMUSCULAR
  Filled 2015-03-28: qty 1

## 2015-03-28 MED ORDER — METFORMIN HCL 500 MG PO TABS
500.0000 mg | ORAL_TABLET | Freq: Every day | ORAL | Status: DC
  Administered 2015-03-28: 500 mg via ORAL
  Filled 2015-03-28: qty 1

## 2015-03-28 MED ORDER — ALPRAZOLAM 0.5 MG PO TABS
0.5000 mg | ORAL_TABLET | Freq: Three times a day (TID) | ORAL | Status: DC
  Administered 2015-03-28 (×3): 0.5 mg via ORAL
  Filled 2015-03-28 (×4): qty 1

## 2015-03-28 MED ORDER — SERTRALINE HCL 50 MG PO TABS
50.0000 mg | ORAL_TABLET | Freq: Every day | ORAL | Status: DC
  Administered 2015-03-28: 50 mg via ORAL
  Filled 2015-03-28: qty 1

## 2015-03-28 MED ORDER — ALPRAZOLAM 0.5 MG PO TABS
0.5000 mg | ORAL_TABLET | Freq: Once | ORAL | Status: AC
  Administered 2015-03-28: 0.5 mg via ORAL

## 2015-03-28 NOTE — ED Notes (Signed)
Pt eating lunch. Sitter at bedside.

## 2015-03-28 NOTE — Progress Notes (Signed)
Per RN Cleotilde NeerJaonna, pt to be IVC-ed and IVC papers will [facility] at [phone] and to [facility] at [phone].  Per Nicholos JohnsKathleen at [facility] patient has bed and IVC papers needed; bed will be held till tomorrow morning.  Melbourne Abtsatia Travarus Trudo, LCSWA Disposition staff 03/28/2015 6:23 PM

## 2015-03-28 NOTE — ED Notes (Signed)
Pt in family room with sitter awaiting TTS consult.

## 2015-03-28 NOTE — ED Notes (Signed)
[employer] at bedside to server papers for ivd

## 2015-03-28 NOTE — ED Notes (Signed)
Patient confused at this time. States that she has been home today, that we put her in a dirty room with old cups of water and old dirty sheets when she came back. Patient was told that she had not left. Patient argumentative. Patient threw her sheets in the floor and demanded clean new ones. Patient also ambulated to the bathroom without difficulty using a walker.

## 2015-03-28 NOTE — Progress Notes (Signed)
This NP attempted to do a Telepsych; machine not working. [name] with [facility] TTS has already put in support tickets for this cart and [facility].   [facility] nurse will continue to try moving the machine to the family room for better reception as a possible solution.    Beau FannyWITHROW, Wilmarie Sparlin C, FNP-BC 03/28/15      01:57 PM

## 2015-03-28 NOTE — Progress Notes (Signed)
Per Nicholos JohnsKathleen at [facility], she received EKG results. States she is trying to get in touch with pt's son who is reportedly Biochemist, clinicalower of Health Attorney for consent prior to proceeding with referral. (Son is Luisa Hartimothy Leung [phone]). CSW asked if assistance contacting him is needed. Nicholos JohnsKathleen advised she would need to talk to POA directly and she will contact CSW when she can proceed.   Ilean SkillMeghan Tonjua Rossetti, MSW, LCSWA Clinical Social Work, Disposition  03/28/2015 [phone]

## 2015-03-28 NOTE — Progress Notes (Signed)
CSW seeking inpatient gero placement.  Faxed to and/or under review: [facility]- per Helen JohnsKathleen, referral received overnight, need EKG. CSW spoke with [facility] RN who states EKG has been completed. CSW will fax to [facility] when results are available [name], [name], fax referral [facility]- per [name], fax referral [name]- per [name], gero pts reviewed on case by case basis, fax referral  [name]  Helen SkillMeghan May [name], MSW, LCSWA Clinical Social Work, Disposition  03/28/2015 [phone]

## 2015-03-28 NOTE — ED Notes (Signed)
Pt continues to be agitated, screaming out in department, attempts to redirect without success, Dr Juleen ChinaKohut notified, additional orders given

## 2015-03-28 NOTE — ED Notes (Signed)
Patient remains agitated. Unable to redirect. Sitter in hallway. Security and [employer] officer in hallway. Patient status discussed with Dr. Juleen ChinaKohut, IVC papers completed. Therapist, nutritionalecretary faxing to [place] Stanleym[facility]istrate office.

## 2015-03-28 NOTE — ED Notes (Signed)
Pt assisted back to bed. Up in room at times.

## 2015-03-28 NOTE — [facility] Counselor (Signed)
Helen Headonrad Withrow NP will do TP at approx 1:15 pm. Writer called [name] back at [facility] to notify her.

## 2015-03-28 NOTE — ED Notes (Signed)
[facility] called about pt. TTS set up for pt.

## 2015-03-28 NOTE — Discharge Instructions (Signed)

## 2015-03-28 NOTE — ED Notes (Signed)
Patient ambulated to bathroom and then returned to her room. States that she can't sleep with someone sitting outside her door. States that the water in her room is not her own. States that she has not had any water at all tonight.

## 2015-03-28 NOTE — ED Notes (Signed)
[facility] facility called requesting information on the pt. Requests ED EKG and record of CXR. Information requested concerning pt's commitment status.

## 2015-03-28 NOTE — ED Notes (Signed)
[employer] at bedside to transport,

## 2015-03-28 NOTE — Consult Note (Signed)
Telepsych Consultation   Reason for Consult:  Altered Mental Status Referring Physician:  EDP Patient Identification: [name] MRN:  [id number] Principal Diagnosis: Dementia Diagnosis:  There are no active problems to display for this patient.   Total Time spent with patient: 25 minutes  Subjective:   [name] is a 81 y.o. female patient admitted with reports of behavioral changes. Pt has known dementia. Pt seen and chart reviewed. Pt's mannerisms, speech, and interview is consistent with a 81 yr old patient with dementia. There was no evidence that pt was responding to internal stimuli. Pt was oriented to self and place, but not to time. However, pt attempted to recall the time and apologized that she could not. She did not appear to be confused or disoriented and this attempt to describe the year and situation was consistent with patients with known dementia rather than psychosis or delirium. Organic etiology has been ruled out by the emergency physician and support staff. It is likely that the patient is responding to the abrupt change in the caregiver status in the home as dementia often presents with patients being most comfortable in known environments; new environments and big changes can certainly contribute to acting out, aggression, or perceived confusion.   HPI:   [name] is an 81 y.o. female.  -Clinician talked to Dr. Reather Converse about need for TTS. He said that Dr. Luan Pulling (primary care physician) had sent patient to [facility] for psych evaluation. She had a UA done but it is unclear if she has a UTI. Dr. Reather Converse said that the sample was cloudy. Patient reportedly has been increasingly aggressive at home. Pt has a caregiver that stays in the home.  Patient denies any current SI, HI or A/V hallucinations. It is documented that daughter reported that patient said she wished she were dead as they were heading to the doctor's office. Patient has no previous history of  suicide attempts. Patient says that she lives by herself.   Patient gave permission for clinician to call [name], her son. [name] is also her healthcare POA. She also has a living will he says. [name] said that patient has been increasingly agitated over the last few weeks. She had a person that came in and helped her for 8 hours per day for about a year. A week ago Sunday (04/24) patient started having a family friend to stay in the home with her 24/7. This caregiver was experienced with working with the elderly. Clinician was told by son that patient had said to the caregiver that she did not need her there all the time, that she could manage by herself. This change in having someone in her home with her all the time could be contributing to the agitation.  Patient today was very verbally aggressive towards the caregiver and was "not acting like herself" according to son. At that time caregiver contacted patient's daughters and they went to the doctor's Luan Pulling) office. Patient was sent to [facility].  Patient is not able to take care of herself without assistance. [name] said that family has looked into whether patient needs to go into a facility but they have not made a decision yet. Patient has made statements about her quality of life not being good, according to [name].  -Clinician talked to [name], [place] who said that if it is a UTI then it needed to be treated. If she does not have a UTI then she may need inpatient gero psych care. Clinician  talked to Dr. Reather Converse who said he would order a urine specimen to check on whether there is a UTI present. Clinician called son and let him know.  -Dr. Reather Converse called clinician back and let him know that the UA came up negative for UTI. Therefore, patient will be referred to a gero psych facility.   HPI Elements:   Location:  Psychiatric. Quality:  Stable. Severity:  Moderate. Timing:  Intermittent. Duration:  Acute  onset. Context:  Exacerbation of underlying dementia likely secondary to caregiver changes.  Past Medical History:  Past Medical History  Diagnosis Date  . Fistula     COLOVAGINAL  . Diverticulitis   . Other chronic cystitis   . Dysuria   . Incomplete bladder emptying   . Nocturia   . Postmenopausal atrophic vaginitis   . Other specified symptom associated with female genital organs   . Anxiety state, unspecified   . Personal history of arthritis   . Unspecified asthma(493.90)   . Chronic airway obstruction, not elsewhere classified   . Other persistent mental disorders due to conditions classified elsewhere   . Depressive disorder, not elsewhere classified   . Type II or unspecified type diabetes mellitus without mention of complication, not stated as uncontrolled   . Esophageal reflux   . Diaphragmatic hernia without mention of obstruction or gangrene   . Insomnia, unspecified   . Other chronic cystitis   . Postmenopausal atrophic vaginitis   . Dysuria   . Dementia     Past Surgical History  Procedure Laterality Date  . Appendectomy    . Hernia repair    . Cataract surgery    . Nissen fundoplication    . Hemorrhoid surgery    . Cystoscopy    . Left kidney sturgery for kidney stones    . Flexible sigmoidoscopy  09/17/2011    Procedure: FLEXIBLE SIGMOIDOSCOPY;  Surgeon: Rogene Houston, MD;  [facility];  Service: Endoscopy;  Laterality: N/A;  8:30 am   Family History: No family history on file. Social History:  History  Alcohol Use No     History  Drug Use No    History   Social History  . Marital Status: Widowed    Spouse Name: N/A  . Number of Children: N/A  . Years of Education: N/A   Social History Main Topics  . Smoking status: Never Smoker   . Smokeless tobacco: Not on file  . Alcohol Use: No  . Drug Use: No  . Sexual Activity: No   Other Topics Concern  . None   Social History Narrative   Additional Social History:    Pain  Medications: See PTA medication list Prescriptions: See PTA medication list Over the Counter: See PTA medication list History of alcohol / drug use?: No history of alcohol / drug abuse                     Allergies:   Allergies  Allergen Reactions  . Ciprocin-Fluocin-Procin [Fluocinolone Acetonide]   . Nitrofurantoin   . Oxybutynin   . Quinine Sulfate   . Sulfa Antibiotics     Labs:  Results for orders placed or performed during the hospital encounter of 03/27/15 (from the past 48 hour(s))  CBC with Differential     Status: Abnormal   Collection Time: 03/27/15  5:55 PM  Result Value Ref Range   WBC 7.3 4.0 - 10.5 K/uL   RBC 4.70 3.87 - 5.11 MIL/uL  Hemoglobin 15.0 12.0 - 15.0 g/dL   HCT 46.1 (H) 36.0 - 46.0 %   MCV 98.1 78.0 - 100.0 fL   MCH 31.9 26.0 - 34.0 pg   MCHC 32.5 30.0 - 36.0 g/dL   RDW 14.4 11.5 - 15.5 %   Platelets 205 150 - 400 K/uL   Neutrophils Relative % 51 43 - 77 %   Neutro Abs 3.6 1.7 - 7.7 K/uL   Lymphocytes Relative 28 12 - 46 %   Lymphs Abs 2.1 0.7 - 4.0 K/uL   Monocytes Relative 20 (H) 3 - 12 %   Monocytes Absolute 1.5 (H) 0.1 - 1.0 K/uL   Eosinophils Relative 1 0 - 5 %   Eosinophils Absolute 0.1 0.0 - 0.7 K/uL   Basophils Relative 0 0 - 1 %   Basophils Absolute 0.0 0.0 - 0.1 K/uL  Basic metabolic panel     Status: Abnormal   Collection Time: 03/27/15  5:55 PM  Result Value Ref Range   Sodium 140 135 - 145 mmol/L   Potassium 4.0 3.5 - 5.1 mmol/L   Chloride 107 101 - 111 mmol/L   CO2 24 22 - 32 mmol/L   Glucose, Bld 85 70 - 99 mg/dL   BUN 12 6 - 20 mg/dL   Creatinine, Ser 0.79 0.44 - 1.00 mg/dL   Calcium 8.8 (L) 8.9 - 10.3 mg/dL   GFR calc non Af Amer >60 >60 mL/min   GFR calc Af Amer >60 >60 mL/min    Comment: (NOTE) The eGFR has been calculated using the CKD EPI equation. This calculation has not been validated in all clinical situations. eGFR's persistently <90 mL/min signify possible Chronic Kidney Disease.    Anion gap 9 5  - 15  Ethanol     Status: None   Collection Time: 03/27/15  5:55 PM  Result Value Ref Range   Alcohol, Ethyl (B) <5 <5 mg/dL    Comment:        LOWEST DETECTABLE LIMIT FOR SERUM ALCOHOL IS 11 mg/dL FOR MEDICAL PURPOSES ONLY   Urinalysis, Routine w reflex microscopic     Status: Abnormal   Collection Time: 03/27/15  6:39 PM  Result Value Ref Range   Color, Urine YELLOW YELLOW   APPearance HAZY (A) CLEAR   Specific Gravity, Urine 1.020 1.005 - 1.030   pH 5.5 5.0 - 8.0   Glucose, UA NEGATIVE NEGATIVE mg/dL   Hgb urine dipstick NEGATIVE NEGATIVE   Bilirubin Urine NEGATIVE NEGATIVE   Ketones, ur NEGATIVE NEGATIVE mg/dL   Protein, ur NEGATIVE NEGATIVE mg/dL   Urobilinogen, UA 0.2 0.0 - 1.0 mg/dL   Nitrite NEGATIVE NEGATIVE   Leukocytes, UA LARGE (A) NEGATIVE  Drug screen panel, emergency     Status: Abnormal   Collection Time: 03/27/15  6:39 PM  Result Value Ref Range   Opiates NONE DETECTED NONE DETECTED   Cocaine NONE DETECTED NONE DETECTED   Benzodiazepines POSITIVE (A) NONE DETECTED   Amphetamines NONE DETECTED NONE DETECTED   Tetrahydrocannabinol NONE DETECTED NONE DETECTED   Barbiturates NONE DETECTED NONE DETECTED    Comment:        DRUG SCREEN FOR MEDICAL PURPOSES ONLY.  IF CONFIRMATION IS NEEDED FOR ANY PURPOSE, NOTIFY LAB WITHIN 5 DAYS.        LOWEST DETECTABLE LIMITS FOR URINE DRUG SCREEN Drug Class       Cutoff (ng/mL) Amphetamine      1000 Barbiturate      200 Benzodiazepine  947 Tricyclics       654 Opiates          300 Cocaine          300 THC              50   Urine microscopic-add on     Status: Abnormal   Collection Time: 03/27/15  6:39 PM  Result Value Ref Range   Squamous Epithelial / LPF MANY (A) RARE   WBC, UA TOO NUMEROUS TO COUNT <3 WBC/hpf   RBC / HPF 0-2 <3 RBC/hpf   Bacteria, UA RARE RARE  Urine culture     Status: None (Preliminary result)   Collection Time: 03/27/15  6:39 PM  Result Value Ref Range   Specimen Description URINE,  CLEAN CATCH    Special Requests NONE    Culture PENDING    Report Status PENDING   Urinalysis, Routine w reflex microscopic     Status: None   Collection Time: 03/28/15 12:00 AM  Result Value Ref Range   Color, Urine YELLOW YELLOW   APPearance CLEAR CLEAR   Specific Gravity, Urine 1.015 1.005 - 1.030   pH 6.0 5.0 - 8.0   Glucose, UA NEGATIVE NEGATIVE mg/dL   Hgb urine dipstick NEGATIVE NEGATIVE   Bilirubin Urine NEGATIVE NEGATIVE   Ketones, ur NEGATIVE NEGATIVE mg/dL   Protein, ur NEGATIVE NEGATIVE mg/dL   Urobilinogen, UA 0.2 0.0 - 1.0 mg/dL   Nitrite NEGATIVE NEGATIVE   Leukocytes, UA NEGATIVE NEGATIVE    Comment: MICROSCOPIC NOT DONE ON URINES WITH NEGATIVE PROTEIN, BLOOD, LEUKOCYTES, NITRITE, OR GLUCOSE <1000 mg/dL.    Vitals: Blood pressure 157/56, pulse 83, temperature 97.9 F (36.6 C), temperature source Oral, resp. rate 20, height 5' 2"  (1.575 m), weight 62.596 kg (138 lb), SpO2 95 %.  Risk to Self: Suicidal Ideation: No Suicidal Intent: No Is patient at risk for suicide?: Yes (Pt had verbalized to daughter she wanted to kill herself on ) Suicidal Plan?: No Access to Means: No What has been your use of drugs/alcohol within the last 12 months?: N/A How many times?: 0 Other Self Harm Risks: None Triggers for Past Attempts: None known Intentional Self Injurious Behavior: None Risk to Others: Homicidal Ideation: No Thoughts of Harm to Others: No-Not Currently Present/Within Last 6 Months (Pt did not want the aid to be help her.) Current Homicidal Intent: No Current Homicidal Plan: No Access to Homicidal Means: No Identified Victim: No one History of harm to others?: No Assessment of Violence: None Noted Violent Behavior Description: None Does patient have access to weapons?: No Criminal Charges Pending?: No Does patient have a court date: No Prior Inpatient Therapy: Prior Inpatient Therapy: No Prior Therapy Dates: N/A Prior Therapy Facilty/Provider(s): N/A Reason  for Treatment: N/a Prior Outpatient Therapy: Prior Outpatient Therapy: No Prior Therapy Dates: None Prior Therapy Facilty/Provider(s): None Reason for Treatment: None Does patient have an ACCT team?: No Does patient have Intensive In-House Services?  : No Does patient have [facility] services? : No Does patient have P4CC services?: No  Current Facility-Administered Medications  Medication Dose Route Frequency Provider Last Rate Last Dose  . ALPRAZolam Duanne Moron) tablet 0.5 mg  0.5 mg Oral TID Elnora Morrison, MD   0.5 mg at 03/28/15 0949  . metFORMIN (GLUCOPHAGE) tablet 500 mg  500 mg Oral Q breakfast Elnora Morrison, MD   500 mg at 03/28/15 0730  . sertraline (ZOLOFT) tablet 50 mg  50 mg Oral Daily Elnora Morrison, MD  50 mg at 03/28/15 6256   Current Outpatient Prescriptions  Medication Sig Dispense Refill  . ALPRAZolam (XANAX) 0.5 MG tablet Take 0.5 mg by mouth 3 (three) times daily.    Marland Kitchen dicyclomine (BENTYL) 20 MG tablet Take 1 tablet by mouth 3 (three) times daily.  11  . metFORMIN (GLUCOPHAGE) 500 MG tablet Take 500 mg by mouth daily with breakfast.    . sertraline (ZOLOFT) 100 MG tablet Take 50 mg by mouth daily.   11  . cephALEXin (KEFLEX) 500 MG capsule Take 1 capsule by mouth 3 (three) times daily. Starting 03/27/2015 x 10 days.  0    Musculoskeletal: UTO, camera  Psychiatric Specialty Exam:     Blood pressure 157/56, pulse 83, temperature 97.9 F (36.6 C), temperature source Oral, resp. rate 20, height 5' 2"  (1.575 m), weight 62.596 kg (138 lb), SpO2 95 %.Body mass index is 25.23 kg/(m^2).  General Appearance: Casual and Fairly Groomed  Eye Contact::  Good  Speech:  Clear and Coherent and Normal Rate  Volume:  Normal  Mood:  Euthymic  Affect:  Appropriate and Congruent  Thought Process:  Tangential consistent with dementia, but easily redirected and pt attempted to participate  Orientation:  Other:  Self, place, not situation, almost correct about year  Thought Content:  WDL   Suicidal Thoughts:  No  Homicidal Thoughts:  No  Memory:  Immediate;   Fair Recent;   Fair Remote;   Fair  Judgement:  Fair  Insight:  Fair  Psychomotor Activity:  Normal  Concentration:  Good  Recall:  Poor  Fund of Knowledge:Fair  Language: Good  Akathisia:  No  Handed:    AIMS (if indicated):     Assets:  Desire for Improvement Resilience Social Support  ADL's:  Impaired  Cognition: Impaired,  Moderate  Sleep:      Medical Decision Making: Established Problem, Stable/Improving (1), Review of Psycho-Social Stressors (1) and Review or order clinical lab tests (1)   Treatment Plan Summary: See below  Plan:  No evidence of imminent risk to self or others at present.   Patient does not meet criteria for psychiatric inpatient admission. Supportive therapy provided about ongoing stressors. Discussed crisis plan, support from social network, calling 911, coming to the Emergency Department, and calling Suicide Hotline.  Disposition: -Discharge home with son -Minimize environmental changes and caregiver changes whenever possible (although may not be possible)  Benjamine Mola, FNP-BC 03/28/2015 3:22 PM

## 2015-03-28 NOTE — Progress Notes (Signed)
This NP attempted to do a Telepsych; machine not working. Ava with [facility] TTS to put in support tickets for [facility].    Beau FannyWITHROW, Deysy Schabel C, FNP-BC 03/28/15      09:07 AM

## 2015-03-28 NOTE — ED Notes (Signed)
Patient yelling out that she has not been drinking and wants to know why she is under arrest. Explained to patient that she was not under arrest and that she would be going to another facility. Patient does not understand what I am talking about at this time. Patient continues to yell, screaming that she is not crazy that the woman that brought her here is crazy. Patient wants to know if her son has been here today. Informed patient that she had visited by her son and daughter. Asked MD to order patients home medications. New orders written by Dr. Gardiner RhymeZavits

## 2015-03-28 NOTE — ED Notes (Signed)
Received report on pt, pt agitated, attempts to redirect pt without success, comfort measures provided, [employer] remains at bedside,

## 2015-03-28 NOTE — ED Notes (Signed)
cbg 125 

## 2015-03-28 NOTE — ED Notes (Signed)
[facility] here to transport pt to [facility],

## 2015-03-28 NOTE — ED Notes (Signed)
Report given to [place] at [facility],

## 2015-03-28 NOTE — ED Notes (Signed)
Contacted Nicholos JohnsKathleen at [facility]. Luisa Hartimothy Schmit, son, is POA. [facility].

## 2015-03-29 LAB — URINE CULTURE

## 2015-04-18 ENCOUNTER — Emergency Department (HOSPITAL_COMMUNITY): Payer: Medicare Other

## 2015-04-18 ENCOUNTER — Encounter (HOSPITAL_COMMUNITY): Payer: Self-pay

## 2015-04-18 ENCOUNTER — Emergency Department (HOSPITAL_COMMUNITY)
Admission: EM | Admit: 2015-04-18 | Discharge: 2015-04-18 | Disposition: A | Discharge status: Home / Self Care | Payer: Medicare Other | Attending: Emergency Medicine | Admitting: Emergency Medicine

## 2015-04-18 DIAGNOSIS — Z8669 Personal history of other diseases of the nervous system and sense organs: Secondary | ICD-10-CM | POA: Diagnosis not present

## 2015-04-18 DIAGNOSIS — Y998 Other external cause status: Secondary | ICD-10-CM | POA: Diagnosis not present

## 2015-04-18 DIAGNOSIS — Z87448 Personal history of other diseases of urinary system: Secondary | ICD-10-CM | POA: Insufficient documentation

## 2015-04-18 DIAGNOSIS — S29001A Unspecified injury of muscle and tendon of front wall of thorax, initial encounter: Secondary | ICD-10-CM | POA: Diagnosis not present

## 2015-04-18 DIAGNOSIS — F0390 Unspecified dementia without behavioral disturbance: Secondary | ICD-10-CM | POA: Diagnosis not present

## 2015-04-18 DIAGNOSIS — F329 Major depressive disorder, single episode, unspecified: Secondary | ICD-10-CM | POA: Diagnosis not present

## 2015-04-18 DIAGNOSIS — W01198A Fall on same level from slipping, tripping and stumbling with subsequent striking against other object, initial encounter: Secondary | ICD-10-CM | POA: Insufficient documentation

## 2015-04-18 DIAGNOSIS — Z79899 Other long term (current) drug therapy: Secondary | ICD-10-CM | POA: Insufficient documentation

## 2015-04-18 DIAGNOSIS — Z8719 Personal history of other diseases of the digestive system: Secondary | ICD-10-CM | POA: Diagnosis not present

## 2015-04-18 DIAGNOSIS — J449 Chronic obstructive pulmonary disease, unspecified: Secondary | ICD-10-CM | POA: Insufficient documentation

## 2015-04-18 DIAGNOSIS — Y9289 Other specified places as the place of occurrence of the external cause: Secondary | ICD-10-CM | POA: Diagnosis not present

## 2015-04-18 DIAGNOSIS — E119 Type 2 diabetes mellitus without complications: Secondary | ICD-10-CM | POA: Diagnosis not present

## 2015-04-18 DIAGNOSIS — F411 Generalized anxiety disorder: Secondary | ICD-10-CM | POA: Diagnosis not present

## 2015-04-18 DIAGNOSIS — Y9389 Activity, other specified: Secondary | ICD-10-CM | POA: Diagnosis not present

## 2015-04-18 DIAGNOSIS — Z872 Personal history of diseases of the skin and subcutaneous tissue: Secondary | ICD-10-CM | POA: Diagnosis not present

## 2015-04-18 DIAGNOSIS — W19XXXA Unspecified fall, initial encounter: Secondary | ICD-10-CM

## 2015-04-18 LAB — CBC WITH DIFFERENTIAL/PLATELET
Basophils Absolute: 0 10*3/uL (ref 0.0–0.1)
Basophils Relative: 0 % (ref 0–1)
EOS PCT: 1 % (ref 0–5)
Eosinophils Absolute: 0.1 10*3/uL (ref 0.0–0.7)
HCT: 45.7 % (ref 36.0–46.0)
HEMOGLOBIN: 14.7 g/dL (ref 12.0–15.0)
LYMPHS PCT: 27 % (ref 12–46)
Lymphs Abs: 2.8 10*3/uL (ref 0.7–4.0)
MCH: 31.1 pg (ref 26.0–34.0)
MCHC: 32.2 g/dL (ref 30.0–36.0)
MCV: 96.8 fL (ref 78.0–100.0)
Monocytes Absolute: 2.4 10*3/uL — ABNORMAL HIGH (ref 0.1–1.0)
Monocytes Relative: 23 % — ABNORMAL HIGH (ref 3–12)
NEUTROS PCT: 49 % (ref 43–77)
Neutro Abs: 5.2 10*3/uL (ref 1.7–7.7)
Platelets: 174 10*3/uL (ref 150–400)
RBC: 4.72 MIL/uL (ref 3.87–5.11)
RDW: 13.8 % (ref 11.5–15.5)
WBC: 10.6 10*3/uL — AB (ref 4.0–10.5)

## 2015-04-18 LAB — I-STAT CHEM 8, ED
BUN: 11 mg/dL (ref 6–20)
CALCIUM ION: 1.1 mmol/L — AB (ref 1.13–1.30)
Chloride: 100 mmol/L — ABNORMAL LOW (ref 101–111)
Creatinine, Ser: 0.8 mg/dL (ref 0.44–1.00)
GLUCOSE: 112 mg/dL — AB (ref 65–99)
HCT: 47 % — ABNORMAL HIGH (ref 36.0–46.0)
Hemoglobin: 16 g/dL — ABNORMAL HIGH (ref 12.0–15.0)
Potassium: 4.2 mmol/L (ref 3.5–5.1)
Sodium: 140 mmol/L (ref 135–145)
TCO2: 27 mmol/L (ref 0–100)

## 2015-04-18 LAB — URINALYSIS, ROUTINE W REFLEX MICROSCOPIC
Bilirubin Urine: NEGATIVE
Glucose, UA: NEGATIVE mg/dL
Hgb urine dipstick: NEGATIVE
Ketones, ur: NEGATIVE mg/dL
Leukocytes, UA: NEGATIVE
Nitrite: NEGATIVE
PROTEIN: NEGATIVE mg/dL
Specific Gravity, Urine: 1.012 (ref 1.005–1.030)
Urobilinogen, UA: 0.2 mg/dL (ref 0.0–1.0)
pH: 6 (ref 5.0–8.0)

## 2015-04-18 NOTE — Discharge Instructions (Signed)
Fall Prevention and Home Safety [name], See your primary care physician within 3 days for close follow-up. If symptoms worsen come back to emergency department immediately. Thank you. Falls cause injuries and can affect all age groups. It is possible to prevent falls.  HOW TO PREVENT FALLS  Wear shoes with rubber soles that do not have an opening for your toes.  Keep the inside and outside of your house well lit.  Use night lights throughout your home.  Remove clutter from floors.  Clean up floor spills.  Remove throw rugs or fasten them to the floor with carpet tape.  Do not place electrical cords across pathways.  Put grab bars by your tub, shower, and toilet. Do not use towel bars as grab bars.  Put handrails on both sides of the stairway. Fix loose handrails.  Do not climb on stools or stepladders, if possible.  Do not wax your floors.  Repair uneven or unsafe sidewalks, walkways, or stairs.  Keep items you use a lot within reach.  Be aware of pets.  Keep emergency numbers next to the telephone.  Put smoke detectors in your home and near bedrooms. Ask your doctor what other things you can do to prevent falls. Document Released: 09/06/2009 Document Revised: 05/11/2012 Document Reviewed: 02/10/2012 [facility] Patient Information 2015 [employer] [place]. This information is not intended to replace advice given to you by your health care provider. Make sure you discuss any questions you have with your health care provider.

## 2015-04-18 NOTE — ED Notes (Addendum)
[facility] approx one hour ago. Pt fell twice within the last hour, the first time she was assisted to the floor, the second time she was found in the floor. Pt c/o right rib pain, no deformities noted. Pt has hx of dementia.

## 2015-04-18 NOTE — ED Notes (Signed)
Report given to Marisue IvanLiz at [facility]

## 2015-04-18 NOTE — ED Notes (Signed)
Bed: [id number] Expected date:  Expected time:  Means of arrival:  Comments: EMS 81 yo female fall x2/rib pain-from SNF

## 2015-04-18 NOTE — ED Provider Notes (Signed)
CSN: [id number]     Arrival date & time 04/18/15  0108 History   First MD Initiated Contact with Patient 04/18/15 0128     Chief Complaint  Patient presents with  . Fall     (Consider location/radiation/quality/duration/timing/severity/associated sxs/prior Treatment) HPI  [name] is a 81 y.o. female with past medical history of dementia, diabetes, diverticulitis presenting today after a fall. Patient comes from a nursing facility. Due to her dementia and she cannot provide her own history. History is obtained via EMS. They state she fell approximately one hour prior to arrival. The first time she fell she was assisted to the floor. The second time she was simply found on the floor. Patient complained to EMS of pain on her right side of her ribs. There is no further history available.     Past Medical History  Diagnosis Date  . Fistula     COLOVAGINAL  . Diverticulitis   . Other chronic cystitis   . Dysuria   . Incomplete bladder emptying   . Nocturia   . Postmenopausal atrophic vaginitis   . Other specified symptom associated with female genital organs   . Anxiety state, unspecified   . Personal history of arthritis   . Unspecified asthma(493.90)   . Chronic airway obstruction, not elsewhere classified   . Other persistent mental disorders due to conditions classified elsewhere   . Depressive disorder, not elsewhere classified   . Type II or unspecified type diabetes mellitus without mention of complication, not stated as uncontrolled   . Esophageal reflux   . Diaphragmatic hernia without mention of obstruction or gangrene   . Insomnia, unspecified   . Other chronic cystitis   . Postmenopausal atrophic vaginitis   . Dysuria   . Dementia    Past Surgical History  Procedure Laterality Date  . Appendectomy    . Hernia repair    . Cataract surgery    . Nissen fundoplication    . Hemorrhoid surgery    . Cystoscopy    . Left kidney sturgery for kidney stones    .  Flexible sigmoidoscopy  09/17/2011    Procedure: FLEXIBLE SIGMOIDOSCOPY;  Surgeon: Malissa HippoNajeeb U Rehman, MD;  Location: [facility];  Service: Endoscopy;  Laterality: N/A;  8:30 am   No family history on file. History  Substance Use Topics  . Smoking status: Never Smoker   . Smokeless tobacco: Not on file  . Alcohol Use: No   OB History    No data available     Review of Systems  Unable to perform ROS: Dementia      Allergies  Ciprocin-fluocin-procin; Nitrofurantoin; Oxybutynin; Quinine sulfate; and Sulfa antibiotics  Home Medications   Prior to Admission medications   Medication Sig Start Date End Date Taking? Authorizing Provider  acetaminophen (TYLENOL) 500 MG tablet Take 500 mg by mouth every 6 (six) hours as needed for mild pain, fever or headache.   Yes Historical Provider, MD  albuterol (PROVENTIL) (2.5 MG/3ML) 0.083% nebulizer solution Take 2.5 mg by nebulization every 6 (six) hours as needed for wheezing or shortness of breath (for 5 days).   Yes Historical Provider, MD  ALPRAZolam (XANAX) 0.25 MG tablet Take 0.25 mg by mouth 2 (two) times daily.   Yes Historical Provider, MD  alum & mag hydroxide-simeth (MAALOX/MYLANTA) 200-200-20 MG/5ML suspension Take 30 mLs by mouth every 6 (six) hours as needed for indigestion or heartburn.   Yes Historical Provider, MD  dicyclomine (BENTYL) 20 MG tablet  Take 1 tablet by mouth 3 (three) times daily. 02/26/15  Yes Historical Provider, MD  divalproex (DEPAKOTE SPRINKLE) 125 MG capsule Take 500 mg by mouth 2 (two) times daily.   Yes Historical Provider, MD  guaifenesin (ROBITUSSIN) 100 MG/5ML syrup Take 200 mg by mouth 3 (three) times daily as needed for cough.   Yes Historical Provider, MD  levofloxacin (LEVAQUIN) 500 MG tablet Take 500 mg by mouth daily. For 10 days   Yes Historical Provider, MD  loperamide (IMODIUM) 2 MG capsule Take 2 mg by mouth as needed for diarrhea or loose stools.   Yes Historical Provider, MD  magnesium hydroxide  (MILK OF MAGNESIA) 400 MG/5ML suspension Take 30 mLs by mouth daily as needed for mild constipation.   Yes Historical Provider, MD  Melatonin 3 MG CAPS Take 3 capsules by mouth at bedtime.   Yes Historical Provider, MD  metFORMIN (GLUCOPHAGE) 500 MG tablet Take 500 mg by mouth 2 (two) times daily with a meal.    Yes Historical Provider, MD  mirtazapine (REMERON) 30 MG tablet Take 30 mg by mouth at bedtime.   Yes Historical Provider, MD  neomycin-bacitracin-polymyxin (NEOSPORIN) [phone] ointment Apply 1 application topically 4 (four) times daily as needed (skin tears/abrasions).   Yes Historical Provider, MD  sertraline (ZOLOFT) 50 MG tablet Take 50 mg by mouth daily.   Yes Historical Provider, MD  Skin Protectants, Misc. (DIMETHICONE-ZINC OXIDE) cream Apply 1 application topically 3 (three) times daily as needed for dry skin (to sacrum). Baza Protect   Yes Historical Provider, MD  tiotropium (SPIRIVA) 18 MCG inhalation capsule Place 18 mcg into inhaler and inhale daily.   Yes Historical Provider, MD  traZODone (DESYREL) 50 MG tablet Take 50 mg by mouth at bedtime.   Yes Historical Provider, MD  Vitamin D, Ergocalciferol, (DRISDOL) 50000 UNITS CAPS capsule Take 50,000 Units by mouth every 7 (seven) days. Tuesdays. D/C after 8 weeks.   Yes Historical Provider, MD   BP 108/44 mmHg  Pulse 85  Temp(Src) 97.5 F (36.4 C) (Axillary)  Resp 16  SpO2 90% Physical Exam  Constitutional: She appears well-developed and well-nourished. No distress.  HENT:  Head: Normocephalic and atraumatic.  Nose: Nose normal.  Mouth/Throat: Oropharynx is clear and moist. No oropharyngeal exudate.  Eyes: Conjunctivae and EOM are normal. Pupils are equal, round, and reactive to light. No scleral icterus.  Neck: Normal range of motion. Neck supple. No JVD present. No tracheal deviation present. No thyromegaly present.  Cardiovascular: Normal rate, regular rhythm and normal heart sounds.  Exam reveals no gallop and no  friction rub.   No murmur heard. Pulmonary/Chest: Effort normal and breath sounds normal. No respiratory distress. She has no wheezes. She exhibits no tenderness.  Abdominal: Soft. Bowel sounds are normal. She exhibits no distension and no mass. There is no tenderness. There is no rebound and no guarding.  Musculoskeletal: Normal range of motion. She exhibits no edema or tenderness.  Lymphadenopathy:    She has no cervical adenopathy.  Neurological: She is alert.  Skin: Skin is warm and dry. No rash noted. She is not diaphoretic. No erythema. No pallor.  Nursing note and vitals reviewed.   ED Course  Procedures (including critical care time) Labs Review Labs Reviewed  CBC WITH DIFFERENTIAL/PLATELET - Abnormal; Notable for the following:    WBC 10.6 (*)    Monocytes Relative 23 (*)    Monocytes Absolute 2.4 (*)    All other components within normal limits  I-STAT CHEM  8, ED - Abnormal; Notable for the following:    Chloride 100 (*)    Glucose, Bld 112 (*)    Calcium, Ion 1.10 (*)    Hemoglobin 16.0 (*)    HCT 47.0 (*)    All other components within normal limits  URINALYSIS, ROUTINE W REFLEX MICROSCOPIC    Imaging Review Dg Ribs Unilateral W/chest Right  04/18/2015   CLINICAL DATA:  Fall with right-sided chest pain. Initial encounter.  EXAM: RIGHT RIBS AND CHEST - 3+ VIEW  COMPARISON:  03/27/2015  FINDINGS: No evidence of acute fracture. A small lucency in the lateral right eleventh rib is indeterminate but stable from 2013 and considered incidental. There is no evidence of hemothorax or pneumothorax.  Chronic elevation of the right diaphragm. Chronic borderline cardiomegaly with bulky mitral annular calcification. Pulmonary interstitial coarsening which is likely bronchitic. No edema.  IMPRESSION: 1. No evidence of rib fracture. 2. Bronchitic markings.   Electronically Signed   By: Marnee Spring M.D.   On: 04/18/2015 01:59   Ct Head Wo Contrast  04/18/2015   CLINICAL DATA:  Post  fall 1 hr ago.  History of dementia.  EXAM: CT HEAD WITHOUT CONTRAST  CT CERVICAL SPINE WITHOUT CONTRAST  TECHNIQUE: Multidetector CT imaging of the head and cervical spine was performed following the standard protocol without intravenous contrast. Multiplanar CT image reconstructions of the cervical spine were also generated.  COMPARISON:  Head CT- 12/10/2009; 09/04/2008  FINDINGS: CT HEAD FINDINGS  Examination is degraded secondary to patient motion artifact necessitating the acquisition of additional images.  Similar findings of advanced atrophy with sulcal prominence and centralized volume loss with commensurate expected dilatation of the ventricular system. Extensive periventricular hypodensities compatible with microvascular ischemic disease. Given extensive background parenchymal abnormalities, there is no CT evidence of superimposed acute large territory infarct. No intraparenchymal or extra-axial mass or hemorrhage. Unchanged size and configuration of the ventricles and basilar cisterns. No midline shift. Intracranial atherosclerosis. Limited visualization of the paranasal sinuses is degraded due to patient motion artifact no negative for discrete air-fluid level.  Regional soft tissues appear normal. Post bilateral cataract surgery. No displaced calvarial fracture.  CT CERVICAL SPINE FINDINGS  C1 to the superior endplate of T3 is imaged.  The head is held in a minimal amount of left lateral flexion. There is minimal (approximately 3 mm) of anterolisthesis of C4 upon C5. The bilateral facets are normally aligned. The dens is normally positioned between the lateral masses of C1. Mild degenerative change of the atlantodental articulation. Normal atlantoaxial articulations.  No fracture or static subluxation of the cervical spine. Cervical vertebral body heights are preserved. Prevertebral soft tissues are normal.  Moderate multilevel cervical spine DDD, worse at C5-C6, C6-C7 and C7-T1 with disc space height  loss, endplate irregularity and sclerosis.  Atherosclerotic plaque within the bilateral carotid bulbs. No bulky cervical lymphadenopathy on this noncontrast examination. Normal noncontrast appearance of the thyroid gland. Limited visualization of lung apices demonstrates slightly asymmetric right apical reticular opacities.  IMPRESSION: 1. Advanced atrophy and microvascular ischemic disease without acute intracranial process. 2. No fracture or static subluxation of the cervical spine. 3. Moderate multilevel cervical spine DDD.   Electronically Signed   By: Simonne Come M.D.   On: 04/18/2015 02:01   Ct Cervical Spine Wo Contrast  04/18/2015   CLINICAL DATA:  Post fall 1 hr ago.  History of dementia.  EXAM: CT HEAD WITHOUT CONTRAST  CT CERVICAL SPINE WITHOUT CONTRAST  TECHNIQUE: Multidetector CT imaging of  the head and cervical spine was performed following the standard protocol without intravenous contrast. Multiplanar CT image reconstructions of the cervical spine were also generated.  COMPARISON:  Head CT- 12/10/2009; 09/04/2008  FINDINGS: CT HEAD FINDINGS  Examination is degraded secondary to patient motion artifact necessitating the acquisition of additional images.  Similar findings of advanced atrophy with sulcal prominence and centralized volume loss with commensurate expected dilatation of the ventricular system. Extensive periventricular hypodensities compatible with microvascular ischemic disease. Given extensive background parenchymal abnormalities, there is no CT evidence of superimposed acute large territory infarct. No intraparenchymal or extra-axial mass or hemorrhage. Unchanged size and configuration of the ventricles and basilar cisterns. No midline shift. Intracranial atherosclerosis. Limited visualization of the paranasal sinuses is degraded due to patient motion artifact no negative for discrete air-fluid level.  Regional soft tissues appear normal. Post bilateral cataract surgery. No displaced  calvarial fracture.  CT CERVICAL SPINE FINDINGS  C1 to the superior endplate of T3 is imaged.  The head is held in a minimal amount of left lateral flexion. There is minimal (approximately 3 mm) of anterolisthesis of C4 upon C5. The bilateral facets are normally aligned. The dens is normally positioned between the lateral masses of C1. Mild degenerative change of the atlantodental articulation. Normal atlantoaxial articulations.  No fracture or static subluxation of the cervical spine. Cervical vertebral body heights are preserved. Prevertebral soft tissues are normal.  Moderate multilevel cervical spine DDD, worse at C5-C6, C6-C7 and C7-T1 with disc space height loss, endplate irregularity and sclerosis.  Atherosclerotic plaque within the bilateral carotid bulbs. No bulky cervical lymphadenopathy on this noncontrast examination. Normal noncontrast appearance of the thyroid gland. Limited visualization of lung apices demonstrates slightly asymmetric right apical reticular opacities.  IMPRESSION: 1. Advanced atrophy and microvascular ischemic disease without acute intracranial process. 2. No fracture or static subluxation of the cervical spine. 3. Moderate multilevel cervical spine DDD.   Electronically Signed   By: Simonne Come M.D.   On: 04/18/2015 02:01     EKG Interpretation   Date/Time:  Wednesday Apr 18 2015 03:02:49 EDT Ventricular Rate:  79 PR Interval:  132 QRS Duration: 80 QT Interval:  398 QTC Calculation: 456 R Axis:   -28 Text Interpretation:  Sinus rhythm Inferior infarct, old No significant  change since last tracing Confirmed by Erroll Luna [phone]) on  04/18/2015 3:16:21 AM      MDM   Final diagnoses:  Fall  Fall    Patient presents emergency department after 2 falls this evening. CT scan of head and cervical spine and chest x-ray of the patient did not reveal any significant injuries.  Also obtain EKG and infectious workup to ensure there is not another cause of this  fall as the patient cannot provide history.  EKG does not show any acute changes. Infectious workup was negative. At this time the patient is comfortable in the room, she does tell me that she has no pain in her body. Her vital signs were within her normal limits and she is safe for discharge back to her nursing facility.  Tomasita Crumble, MD 04/18/15 [phone]

## 2015-04-22 ENCOUNTER — Emergency Department (HOSPITAL_COMMUNITY): Payer: Medicare Other

## 2015-04-22 ENCOUNTER — Encounter (HOSPITAL_COMMUNITY): Payer: Self-pay | Admitting: *Deleted

## 2015-04-22 ENCOUNTER — Emergency Department (HOSPITAL_COMMUNITY)
Admission: EM | Admit: 2015-04-22 | Discharge: 2015-04-22 | Disposition: A | Discharge status: Home / Self Care | Payer: Medicare Other | Attending: Emergency Medicine | Admitting: Emergency Medicine

## 2015-04-22 DIAGNOSIS — J449 Chronic obstructive pulmonary disease, unspecified: Secondary | ICD-10-CM | POA: Diagnosis not present

## 2015-04-22 DIAGNOSIS — Y999 Unspecified external cause status: Secondary | ICD-10-CM | POA: Insufficient documentation

## 2015-04-22 DIAGNOSIS — R059 Cough, unspecified: Secondary | ICD-10-CM

## 2015-04-22 DIAGNOSIS — F419 Anxiety disorder, unspecified: Secondary | ICD-10-CM | POA: Insufficient documentation

## 2015-04-22 DIAGNOSIS — Y939 Activity, unspecified: Secondary | ICD-10-CM | POA: Insufficient documentation

## 2015-04-22 DIAGNOSIS — Z8719 Personal history of other diseases of the digestive system: Secondary | ICD-10-CM | POA: Diagnosis not present

## 2015-04-22 DIAGNOSIS — Y929 Unspecified place or not applicable: Secondary | ICD-10-CM | POA: Insufficient documentation

## 2015-04-22 DIAGNOSIS — F0390 Unspecified dementia without behavioral disturbance: Secondary | ICD-10-CM | POA: Insufficient documentation

## 2015-04-22 DIAGNOSIS — W19XXXA Unspecified fall, initial encounter: Secondary | ICD-10-CM

## 2015-04-22 DIAGNOSIS — M542 Cervicalgia: Secondary | ICD-10-CM

## 2015-04-22 DIAGNOSIS — F329 Major depressive disorder, single episode, unspecified: Secondary | ICD-10-CM | POA: Insufficient documentation

## 2015-04-22 DIAGNOSIS — W06XXXA Fall from bed, initial encounter: Secondary | ICD-10-CM | POA: Insufficient documentation

## 2015-04-22 DIAGNOSIS — S199XXA Unspecified injury of neck, initial encounter: Secondary | ICD-10-CM | POA: Insufficient documentation

## 2015-04-22 DIAGNOSIS — R05 Cough: Secondary | ICD-10-CM

## 2015-04-22 DIAGNOSIS — Z8669 Personal history of other diseases of the nervous system and sense organs: Secondary | ICD-10-CM | POA: Diagnosis not present

## 2015-04-22 DIAGNOSIS — Z8742 Personal history of other diseases of the female genital tract: Secondary | ICD-10-CM | POA: Insufficient documentation

## 2015-04-22 DIAGNOSIS — Z872 Personal history of diseases of the skin and subcutaneous tissue: Secondary | ICD-10-CM | POA: Insufficient documentation

## 2015-04-22 DIAGNOSIS — Z792 Long term (current) use of antibiotics: Secondary | ICD-10-CM | POA: Insufficient documentation

## 2015-04-22 DIAGNOSIS — E119 Type 2 diabetes mellitus without complications: Secondary | ICD-10-CM | POA: Insufficient documentation

## 2015-04-22 DIAGNOSIS — Z79899 Other long term (current) drug therapy: Secondary | ICD-10-CM | POA: Diagnosis not present

## 2015-04-22 LAB — BASIC METABOLIC PANEL
Anion gap: 9 (ref 5–15)
BUN: 14 mg/dL (ref 6–20)
CALCIUM: 8.4 mg/dL — AB (ref 8.9–10.3)
CO2: 27 mmol/L (ref 22–32)
Chloride: 100 mmol/L — ABNORMAL LOW (ref 101–111)
Creatinine, Ser: 0.78 mg/dL (ref 0.44–1.00)
GFR calc Af Amer: >60 mL/min (ref >60)
GFR calc non Af Amer: >60 mL/min (ref >60)
Glucose, Bld: 116 mg/dL — ABNORMAL HIGH (ref 65–99)
Potassium: 4.9 mmol/L (ref 3.5–5.1)
SODIUM: 136 mmol/L (ref 135–145)

## 2015-04-22 LAB — CBC WITH DIFFERENTIAL/PLATELET
BASOS ABS: 0 10*3/uL (ref 0.0–0.1)
Basophils Relative: 0 % (ref 0–1)
EOS ABS: 0.1 10*3/uL (ref 0.0–0.7)
Eosinophils Relative: 1 % (ref 0–5)
HCT: 42.9 % (ref 36.0–46.0)
HEMOGLOBIN: 14.3 g/dL (ref 12.0–15.0)
LYMPHS ABS: 2.7 10*3/uL (ref 0.7–4.0)
Lymphocytes Relative: 29 % (ref 12–46)
MCH: 31.8 pg (ref 26.0–34.0)
MCHC: 33.3 g/dL (ref 30.0–36.0)
MCV: 95.5 fL (ref 78.0–100.0)
Monocytes Absolute: 2.5 10*3/uL — ABNORMAL HIGH (ref 0.1–1.0)
Monocytes Relative: 28 % — ABNORMAL HIGH (ref 3–12)
Neutro Abs: 3.8 10*3/uL (ref 1.7–7.7)
Neutrophils Relative %: 42 % — ABNORMAL LOW (ref 43–77)
Platelets: 166 10*3/uL (ref 150–400)
RBC: 4.49 MIL/uL (ref 3.87–5.11)
RDW: 14.1 % (ref 11.5–15.5)
WBC: 9.1 10*3/uL (ref 4.0–10.5)

## 2015-04-22 NOTE — ED Notes (Signed)
Pt being discharged home back to [facility] with ptar.

## 2015-04-22 NOTE — ED Notes (Signed)
Pt ambulated in hall with assistance of this RN and Molly MaduroRobert, [place].

## 2015-04-22 NOTE — ED Provider Notes (Signed)
CSN: [id number]     Arrival date & time 04/22/15  0057 History  This chart was scribed for Derwood Kaplan, MD by Phillis Haggis, ED Scribe. This patient was seen in room A02C/A02C and patient care was started at 2:03 AM.   Chief Complaint  Patient presents with  . Fall   The history is limited by the condition of the patient. No language interpreter was used.  HPI Comments (Level 5 Caveat due to Dementia): [name] is a 81 y.o. female who presents to the Emergency Department brought in by EMS complaining of a fall onset PTA. She reports neck pain. She states that she falls often. She denies pain anywhere. Per nurse, nursing home was unable to get in contact with family and called EMS.   Past Medical History  Diagnosis Date  . Fistula     COLOVAGINAL  . Diverticulitis   . Other chronic cystitis   . Dysuria   . Incomplete bladder emptying   . Nocturia   . Postmenopausal atrophic vaginitis   . Other specified symptom associated with female genital organs   . Anxiety state, unspecified   . Personal history of arthritis   . Unspecified asthma(493.90)   . Chronic airway obstruction, not elsewhere classified   . Other persistent mental disorders due to conditions classified elsewhere   . Depressive disorder, not elsewhere classified   . Type II or unspecified type diabetes mellitus without mention of complication, not stated as uncontrolled   . Esophageal reflux   . Diaphragmatic hernia without mention of obstruction or gangrene   . Insomnia, unspecified   . Other chronic cystitis   . Postmenopausal atrophic vaginitis   . Dysuria   . Dementia    Past Surgical History  Procedure Laterality Date  . Appendectomy    . Hernia repair    . Cataract surgery    . Nissen fundoplication    . Hemorrhoid surgery    . Cystoscopy    . Left kidney sturgery for kidney stones    . Flexible sigmoidoscopy  09/17/2011    Procedure: FLEXIBLE SIGMOIDOSCOPY;  Surgeon: Malissa Hippo, MD;   Location: [facility];  Service: Endoscopy;  Laterality: N/A;  8:30 am   History reviewed. No pertinent family history. History  Substance Use Topics  . Smoking status: Never Smoker   . Smokeless tobacco: Not on file  . Alcohol Use: No   OB History    No data available     Review of Systems  Unable to perform ROS: Dementia   Allergies  Ciprocin-fluocin-procin; Nitrofurantoin; Oxybutynin; Quinine sulfate; and Sulfa antibiotics  Home Medications   Prior to Admission medications   Medication Sig Start Date End Date Taking? Authorizing Provider  acetaminophen (TYLENOL) 500 MG tablet Take 500 mg by mouth every 6 (six) hours as needed for mild pain, fever or headache.   Yes Historical Provider, MD  albuterol (PROVENTIL) (2.5 MG/3ML) 0.083% nebulizer solution Take 2.5 mg by nebulization every 6 (six) hours as needed for wheezing or shortness of breath (for 5 days).   Yes Historical Provider, MD  ALPRAZolam (XANAX) 0.25 MG tablet Take 0.25 mg by mouth 2 (two) times daily.   Yes Historical Provider, MD  alum & mag hydroxide-simeth (MAALOX/MYLANTA) 200-200-20 MG/5ML suspension Take 30 mLs by mouth every 6 (six) hours as needed for indigestion or heartburn.   Yes Historical Provider, MD  dicyclomine (BENTYL) 20 MG tablet Take 1 tablet by mouth 3 (three) times daily. 02/26/15  Yes Historical Provider, MD  divalproex (DEPAKOTE SPRINKLE) 125 MG capsule Take 500 mg by mouth 2 (two) times daily.   Yes Historical Provider, MD  guaifenesin (ROBITUSSIN) 100 MG/5ML syrup Take 200 mg by mouth 3 (three) times daily as needed for cough.   Yes Historical Provider, MD  levofloxacin (LEVAQUIN) 500 MG tablet Take 500 mg by mouth daily. For 10 days   Yes Historical Provider, MD  loperamide (IMODIUM) 2 MG capsule Take 2 mg by mouth as needed for diarrhea or loose stools.   Yes Historical Provider, MD  magnesium hydroxide (MILK OF MAGNESIA) 400 MG/5ML suspension Take 30 mLs by mouth daily as needed for mild  constipation.   Yes Historical Provider, MD  Melatonin 3 MG CAPS Take 3 capsules by mouth at bedtime.   Yes Historical Provider, MD  metFORMIN (GLUCOPHAGE) 500 MG tablet Take 500 mg by mouth 2 (two) times daily with a meal.    Yes Historical Provider, MD  mirtazapine (REMERON) 30 MG tablet Take 30 mg by mouth at bedtime.   Yes Historical Provider, MD  neomycin-bacitracin-polymyxin (NEOSPORIN) [phone] ointment Apply 1 application topically 4 (four) times daily as needed (skin tears/abrasions).   Yes Historical Provider, MD  sertraline (ZOLOFT) 50 MG tablet Take 50 mg by mouth daily.   Yes Historical Provider, MD  Skin Protectants, Misc. (DIMETHICONE-ZINC OXIDE) cream Apply 1 application topically 3 (three) times daily as needed for dry skin (to sacrum). Baza Protect   Yes Historical Provider, MD  tiotropium (SPIRIVA) 18 MCG inhalation capsule Place 18 mcg into inhaler and inhale daily.   Yes Historical Provider, MD  traZODone (DESYREL) 50 MG tablet Take 50 mg by mouth at bedtime as needed for sleep.    Yes Historical Provider, MD  Vitamin D, Ergocalciferol, (DRISDOL) 50000 UNITS CAPS capsule Take 50,000 Units by mouth every 7 (seven) days. Tuesdays. D/C after 8 weeks.   Yes Historical Provider, MD   BP 111/52 mmHg  Pulse 80  Temp(Src) 97.7 F (36.5 C) (Oral)  Resp 18  Ht  (1.575 m)  Wt 138 lb (62.596 kg)  BMI 25.23 kg/m2  SpO2 90%  Physical Exam  Constitutional: She is oriented to person, place, and time. She appears well-developed and well-nourished.  HENT:  Head: Normocephalic and atraumatic.  Eyes: EOM are normal.  Neck: Normal range of motion. Neck supple. No JVD present.  Cardiovascular: Normal rate, regular rhythm and normal heart sounds.   Pulmonary/Chest: Effort normal and breath sounds normal.  Musculoskeletal: Normal range of motion.  2+ pitting edema bilateral lower extremities.   Head to toe evaluation shows no hematoma, bleeding of the scalp, no facial abrasions,  step offs, crepitus, no tenderness to palpation of the bilateral upper and lower extremities, no gross deformities, no chest tenderness, no pelvic pain.   Neurological: She is alert and oriented to person, place, and time.  Skin: Skin is warm and dry.  Psychiatric: She has a normal mood and affect. Her behavior is normal.  Nursing note and vitals reviewed.   ED Course  Procedures (including critical care time) DIAGNOSTIC STUDIES: Oxygen Saturation is 90% on room air, low by my interpretation.    COORDINATION OF CARE: 2:06 AM-Will perform EKG  Labs Review Labs Reviewed  CBC WITH DIFFERENTIAL/PLATELET - Abnormal; Notable for the following:    Neutrophils Relative % 42 (*)    Monocytes Relative 28 (*)    Monocytes Absolute 2.5 (*)    All other components within normal limits  BASIC  METABOLIC PANEL - Abnormal; Notable for the following:    Chloride 100 (*)    Glucose, Bld 116 (*)    Calcium 8.4 (*)    All other components within normal limits  URINE CULTURE  URINALYSIS, ROUTINE W REFLEX MICROSCOPIC (NOT AT [facility])   Imaging Review Dg Chest 2 View  04/22/2015   CLINICAL DATA:  Multiple falls, recent fall with neck pain. History of dementia.  EXAM: CHEST  2 VIEW  COMPARISON:  Chest radiograph Apr 18, 2015  FINDINGS: Cardiac silhouette is upper limits of normal in size, mediastinal silhouette is nonsuspicious, calcified aortic knob. Mitral annular calcifications. Similar bronchitic changes. Elevated RIGHT hemidiaphragm with colonic interpositioning. No pneumothorax.  Surgical clips projected GE junction. Age indeterminate single moderate upper thoracic and mild L2, L4 compression fractures. Patient is osteopenic.  IMPRESSION: Similarly elevated RIGHT hemidiaphragm and bronchitic changes.  Age indeterminate thoracolumbar compression fractures.   Electronically Signed   By: Awilda Metro M.D.   On: 04/22/2015 03:45   Dg Pelvis 1-2 Views  04/22/2015   CLINICAL DATA:  Multiple falls,  recent fall with neck pain. History of dementia.  EXAM: PELVIS - 1-2 VIEW  COMPARISON:  Abdominal radiograph September 09, 2013  FINDINGS: No acute fracture deformity. Femoral heads are well formed and located. Remote LEFT pubic symphyseal fracture. Osteopenia. No destructive bony lesions. Ovoid sub cm calcification projecting LEFT abdomen could reflect contrast in a diverticulum or, granuloma. Mild vascular calcifications.  IMPRESSION: No acute fracture deformity or dislocation. Old LEFT pubic symphyseal fracture.  Osteopenia, which decreases sensitivity for acute nondisplaced fracture.   Electronically Signed   By: Awilda Metro M.D.   On: 04/22/2015 03:42   Ct Head Wo Contrast  04/22/2015   CLINICAL DATA:  Unwitnessed fall.  Neck pain.  EXAM: CT HEAD WITHOUT CONTRAST  CT CERVICAL SPINE WITHOUT CONTRAST  TECHNIQUE: Multidetector CT imaging of the head and cervical spine was performed following the standard protocol without intravenous contrast. Multiplanar CT image reconstructions of the cervical spine were also generated.  COMPARISON:  04/18/2015  FINDINGS: CT HEAD FINDINGS  Skull and Sinuses:Negative for fracture or destructive process. The mastoids, middle ears, and imaged paranasal sinuses are clear.  Orbits: Bilateral cataract resection.  No traumatic findings.  Brain: No evidence of acute infarction, hemorrhage, hydrocephalus, or mass lesion/mass effect. Generalized brain atrophy which is advanced and correlates with history of dementia. Prominent mesial temporal lobe involvement implies Alzheimer's disease. There is extensive chronic vessel disease with ischemic gliosis throughout the bilateral cerebral white matter.  CT CERVICAL SPINE FINDINGS  Negative for acute fracture or subluxation. No prevertebral edema. No gross cervical canal hematoma. Degenerative disc disease, with advanced disc narrowing and endplate spurring at C5-6, C6-7, and C7-T1. Facet arthropathy with overgrowth is greater in the  upper cervical spine. No evidence for high-grade canal stenosis.  IMPRESSION: 1. No evidence of intracranial or cervical spine injury. 2. Involutional changes noted above.   Electronically Signed   By: Marnee Spring M.D.   On: 04/22/2015 03:22   Ct Cervical Spine Wo Contrast  04/22/2015   CLINICAL DATA:  Unwitnessed fall.  Neck pain.  EXAM: CT HEAD WITHOUT CONTRAST  CT CERVICAL SPINE WITHOUT CONTRAST  TECHNIQUE: Multidetector CT imaging of the head and cervical spine was performed following the standard protocol without intravenous contrast. Multiplanar CT image reconstructions of the cervical spine were also generated.  COMPARISON:  04/18/2015  FINDINGS: CT HEAD FINDINGS  Skull and Sinuses:Negative for fracture or destructive process. The  mastoids, middle ears, and imaged paranasal sinuses are clear.  Orbits: Bilateral cataract resection.  No traumatic findings.  Brain: No evidence of acute infarction, hemorrhage, hydrocephalus, or mass lesion/mass effect. Generalized brain atrophy which is advanced and correlates with history of dementia. Prominent mesial temporal lobe involvement implies Alzheimer's disease. There is extensive chronic vessel disease with ischemic gliosis throughout the bilateral cerebral white matter.  CT CERVICAL SPINE FINDINGS  Negative for acute fracture or subluxation. No prevertebral edema. No gross cervical canal hematoma. Degenerative disc disease, with advanced disc narrowing and endplate spurring at C5-6, C6-7, and C7-T1. Facet arthropathy with overgrowth is greater in the upper cervical spine. No evidence for high-grade canal stenosis.  IMPRESSION: 1. No evidence of intracranial or cervical spine injury. 2. Involutional changes noted above.   Electronically Signed   By: Marnee SpringJonathon  Watts M.D.   On: 04/22/2015 03:22     EKG Interpretation None       Date: 04/22/2015  Rate: 80  Rhythm: normal sinus rhythm  QRS Axis: normal  Intervals: normal  ST/T Wave abnormalities:  normal  Conduction Disutrbances: none  Narrative Interpretation: unremarkable      MDM   Final diagnoses:  Posterior neck pain  Fall  Posterior neck pain  Fall  Posterior neck pain  Fall  Cough   I personally performed the services described in this documentation, which was scribed in my presence. The recorded information has been reviewed and is accurate.   Pt comes in with cc of fall.  Imaging is neg. CXr ordered due to cough - and is neg. Labs are reassuring. Will d.c.    Derwood KaplanAnkit Pauletta Pickney, MD 04/22/15 [id number]

## 2015-04-22 NOTE — Discharge Instructions (Signed)
We saw you in the ER after you had a fall. °All the imaging results are normal, no fractures seen. No evidence of brain bleed. °Please be very careful with walking, and do everything possible to prevent falls. ° ° °Fall Prevention and Home Safety °Falls cause injuries and can affect all age groups. It is possible to use preventive measures to significantly decrease the likelihood of falls. There are many simple measures which can make your home safer and prevent falls. °OUTDOORS °· Repair cracks and edges of walkways and driveways. °· Remove high doorway thresholds. °· Trim shrubbery on the main path into your home. °· Have good outside lighting. °· Clear walkways of tools, rocks, debris, and clutter. °· Check that handrails are not broken and are securely fastened. Both sides of steps should have handrails. °· Have leaves, snow, and ice cleared regularly. °· Use sand or salt on walkways during winter months. °· In the garage, clean up grease or oil spills. °BATHROOM °· Install night lights. °· Install grab bars by the toilet and in the tub and shower. °· Use non-skid mats or decals in the tub or shower. °· Place a plastic non-slip stool in the shower to sit on, if needed. °· Keep floors dry and clean up all water on the floor immediately. °· Remove soap buildup in the tub or shower on a regular basis. °· Secure bath mats with non-slip, double-sided rug tape. °· Remove throw rugs and tripping hazards from the floors. °BEDROOMS °· Install night lights. °· Make sure a bedside light is easy to reach. °· Do not use oversized bedding. °· Keep a telephone by your bedside. °· Have a firm chair with side arms to use for getting dressed. °· Remove throw rugs and tripping hazards from the floor. °KITCHEN °· Keep handles on pots and pans turned toward the center of the stove. Use back burners when possible. °· Clean up spills quickly and allow time for drying. °· Avoid walking on wet floors. °· Avoid hot utensils and  knives. °· Position shelves so they are not too high or low. °· Place commonly used objects within easy reach. °· If necessary, use a sturdy step stool with a grab bar when reaching. °· Keep electrical cables out of the way. °· Do not use floor polish or wax that makes floors slippery. If you must use wax, use non-skid floor wax. °· Remove throw rugs and tripping hazards from the floor. °STAIRWAYS °· Never leave objects on stairs. °· Place handrails on both sides of stairways and use them. Fix any loose handrails. Make sure handrails on both sides of the stairways are as long as the stairs. °· Check carpeting to make sure it is firmly attached along stairs. Make repairs to worn or loose carpet promptly. °· Avoid placing throw rugs at the top or bottom of stairways, or properly secure the rug with carpet tape to prevent slippage. Get rid of throw rugs, if possible. °· Have an electrician put in a light switch at the top and bottom of the stairs. °OTHER FALL PREVENTION TIPS °· Wear low-heel or rubber-soled shoes that are supportive and fit well. Wear closed toe shoes. °· When using a stepladder, make sure it is fully opened and both spreaders are firmly locked. Do not climb a closed stepladder. °· Add color or contrast paint or tape to grab bars and handrails in your home. Place contrasting color strips on first and last steps. °· Learn and use mobility aids as   needed. Install an electrical emergency response system. °· Turn on lights to avoid dark areas. Replace light bulbs that burn out immediately. Get light switches that glow. °· Arrange furniture to create clear pathways. Keep furniture in the same place. °· Firmly attach carpet with non-skid or double-sided tape. °· Eliminate uneven floor surfaces. °· Select a carpet pattern that does not visually hide the edge of steps. °· Be aware of all pets. °OTHER HOME SAFETY TIPS °· Set the water temperature for 120° F (48.8° C). °· Keep emergency numbers on or near the  telephone. °· Keep smoke detectors on every level of the home and near sleeping areas. °Document Released: 10/31/2002 Document Revised: 05/11/2012 Document Reviewed: 01/30/2012 °[employer]® Patient Information ©2015 [employer]. This information is not intended to replace advice given to you by your health care provider. Make sure you discuss any questions you have with your health care provider. ° °

## 2015-04-22 NOTE — ED Notes (Signed)
PER EMS[facility] here tonight due to unwitnessed fall. EMS states pt slid out from her bed tonight and landed on a mat. No injuries noted.

## 2015-04-25 ENCOUNTER — Emergency Department (HOSPITAL_COMMUNITY): Payer: Medicare Other

## 2015-04-25 ENCOUNTER — Emergency Department (HOSPITAL_COMMUNITY)
Admission: EM | Admit: 2015-04-25 | Discharge: 2015-04-25 | Disposition: A | Discharge status: Home / Self Care | Payer: Medicare Other | Attending: Emergency Medicine | Admitting: Emergency Medicine

## 2015-04-25 ENCOUNTER — Encounter (HOSPITAL_COMMUNITY): Payer: Self-pay | Admitting: *Deleted

## 2015-04-25 DIAGNOSIS — Y998 Other external cause status: Secondary | ICD-10-CM | POA: Diagnosis not present

## 2015-04-25 DIAGNOSIS — F0280 Dementia in other diseases classified elsewhere without behavioral disturbance: Secondary | ICD-10-CM | POA: Diagnosis not present

## 2015-04-25 DIAGNOSIS — Z792 Long term (current) use of antibiotics: Secondary | ICD-10-CM | POA: Diagnosis not present

## 2015-04-25 DIAGNOSIS — Z872 Personal history of diseases of the skin and subcutaneous tissue: Secondary | ICD-10-CM | POA: Diagnosis not present

## 2015-04-25 DIAGNOSIS — W06XXXA Fall from bed, initial encounter: Secondary | ICD-10-CM | POA: Insufficient documentation

## 2015-04-25 DIAGNOSIS — K219 Gastro-esophageal reflux disease without esophagitis: Secondary | ICD-10-CM | POA: Insufficient documentation

## 2015-04-25 DIAGNOSIS — J45909 Unspecified asthma, uncomplicated: Secondary | ICD-10-CM | POA: Insufficient documentation

## 2015-04-25 DIAGNOSIS — Z043 Encounter for examination and observation following other accident: Secondary | ICD-10-CM | POA: Insufficient documentation

## 2015-04-25 DIAGNOSIS — Y9389 Activity, other specified: Secondary | ICD-10-CM | POA: Insufficient documentation

## 2015-04-25 DIAGNOSIS — W19XXXA Unspecified fall, initial encounter: Secondary | ICD-10-CM

## 2015-04-25 DIAGNOSIS — Y9289 Other specified places as the place of occurrence of the external cause: Secondary | ICD-10-CM | POA: Diagnosis not present

## 2015-04-25 DIAGNOSIS — Z79899 Other long term (current) drug therapy: Secondary | ICD-10-CM | POA: Diagnosis not present

## 2015-04-25 DIAGNOSIS — F419 Anxiety disorder, unspecified: Secondary | ICD-10-CM | POA: Insufficient documentation

## 2015-04-25 DIAGNOSIS — G309 Alzheimer's disease, unspecified: Secondary | ICD-10-CM | POA: Diagnosis not present

## 2015-04-25 DIAGNOSIS — E119 Type 2 diabetes mellitus without complications: Secondary | ICD-10-CM | POA: Insufficient documentation

## 2015-04-25 DIAGNOSIS — M1990 Unspecified osteoarthritis, unspecified site: Secondary | ICD-10-CM | POA: Diagnosis not present

## 2015-04-25 DIAGNOSIS — Z8742 Personal history of other diseases of the female genital tract: Secondary | ICD-10-CM | POA: Insufficient documentation

## 2015-04-25 LAB — URINALYSIS, ROUTINE W REFLEX MICROSCOPIC
BILIRUBIN URINE: NEGATIVE
GLUCOSE, UA: NEGATIVE mg/dL
HGB URINE DIPSTICK: NEGATIVE
KETONES UR: NEGATIVE mg/dL
LEUKOCYTES UA: NEGATIVE
Nitrite: NEGATIVE
Protein, ur: NEGATIVE mg/dL
SPECIFIC GRAVITY, URINE: 1.011 (ref 1.005–1.030)
Urobilinogen, UA: 1 mg/dL (ref 0.0–1.0)
pH: 6.5 (ref 5.0–8.0)

## 2015-04-25 NOTE — ED Provider Notes (Signed)
CSN: [id number]     Arrival date & time 04/25/15  0000 History   First MD Initiated Contact with Patient 04/25/15 0144     Chief Complaint  Patient presents with  . Fall     (Consider location/radiation/quality/duration/timing/severity/associated sxs/prior Treatment) HPI Comments: Pt comes in with fall. Seen 2 days ago for the same. Pt has no complains and wants to leave. She is demented.  LEVEL 5 CAVEAT FOR DEMENTIA.  Patient is a 81 y.o. female presenting with fall. The history is provided by the patient.  Fall    Past Medical History  Diagnosis Date  . Fistula     COLOVAGINAL  . Diverticulitis   . Other chronic cystitis   . Dysuria   . Incomplete bladder emptying   . Nocturia   . Postmenopausal atrophic vaginitis   . Other specified symptom associated with female genital organs   . Anxiety state, unspecified   . Personal history of arthritis   . Unspecified asthma(493.90)   . Chronic airway obstruction, not elsewhere classified   . Other persistent mental disorders due to conditions classified elsewhere   . Depressive disorder, not elsewhere classified   . Type II or unspecified type diabetes mellitus without mention of complication, not stated as uncontrolled   . Esophageal reflux   . Diaphragmatic hernia without mention of obstruction or gangrene   . Insomnia, unspecified   . Other chronic cystitis   . Postmenopausal atrophic vaginitis   . Dysuria   . Dementia    Past Surgical History  Procedure Laterality Date  . Appendectomy    . Hernia repair    . Cataract surgery    . Nissen fundoplication    . Hemorrhoid surgery    . Cystoscopy    . Left kidney sturgery for kidney stones    . Flexible sigmoidoscopy  09/17/2011    Procedure: FLEXIBLE SIGMOIDOSCOPY;  Surgeon: Malissa Hippo, MD;  Location: [facility];  Service: Endoscopy;  Laterality: N/A;  8:30 am   No family history on file. History  Substance Use Topics  . Smoking status: Never Smoker   .  Smokeless tobacco: Not on file  . Alcohol Use: No   OB History    No data available     Review of Systems  Unable to perform ROS: Dementia      Allergies  Lactose intolerance (gi); Ciprocin-fluocin-procin; Nitrofurantoin; Oxybutynin; Quinine sulfate; and Sulfa antibiotics  Home Medications   Prior to Admission medications   Medication Sig Start Date End Date Taking? Authorizing Provider  acetaminophen (TYLENOL) 500 MG tablet Take 500 mg by mouth every 6 (six) hours as needed for mild pain, fever or headache.   Yes Historical Provider, MD  albuterol (PROVENTIL) (2.5 MG/3ML) 0.083% nebulizer solution Take 2.5 mg by nebulization every 6 (six) hours as needed for wheezing or shortness of breath.    Yes Historical Provider, MD  ALPRAZolam (XANAX) 0.25 MG tablet Take 0.25 mg by mouth 2 (two) times daily.   Yes Historical Provider, MD  alum & mag hydroxide-simeth (MAALOX/MYLANTA) 200-200-20 MG/5ML suspension Take 30 mLs by mouth every 6 (six) hours as needed for indigestion or heartburn.   Yes Historical Provider, MD  dicyclomine (BENTYL) 20 MG tablet Take 1 tablet by mouth 3 (three) times daily. 02/26/15  Yes Historical Provider, MD  divalproex (DEPAKOTE SPRINKLE) 125 MG capsule Take 500 mg by mouth 2 (two) times daily.   Yes Historical Provider, MD  guaifenesin (ROBITUSSIN) 100 MG/5ML syrup Take 200  mg by mouth 3 (three) times daily as needed for cough.   Yes Historical Provider, MD  levofloxacin (LEVAQUIN) 500 MG tablet Take 500 mg by mouth daily. For 10 days   Yes Historical Provider, MD  loperamide (IMODIUM) 2 MG capsule Take 2 mg by mouth as needed for diarrhea or loose stools.   Yes Historical Provider, MD  magnesium hydroxide (MILK OF MAGNESIA) 400 MG/5ML suspension Take 30 mLs by mouth daily as needed for mild constipation.   Yes Historical Provider, MD  Melatonin 3 MG CAPS Take 3 capsules by mouth at bedtime.   Yes Historical Provider, MD  metFORMIN (GLUCOPHAGE) 500 MG tablet Take 500  mg by mouth 2 (two) times daily with a meal.    Yes Historical Provider, MD  mirtazapine (REMERON) 30 MG tablet Take 30 mg by mouth at bedtime.   Yes Historical Provider, MD  neomycin-bacitracin-polymyxin (NEOSPORIN) [phone] ointment Apply 1 application topically 4 (four) times daily as needed (skin tears/abrasions).   Yes Historical Provider, MD  sertraline (ZOLOFT) 50 MG tablet Take 50 mg by mouth daily.   Yes Historical Provider, MD  Skin Protectants, Misc. (BAZA PROTECT EX) Apply 1 application topically 3 (three) times daily. Applied to sacrum   Yes Historical Provider, MD  tiotropium (SPIRIVA) 18 MCG inhalation capsule Place 18 mcg into inhaler and inhale daily.   Yes Historical Provider, MD  traZODone (DESYREL) 50 MG tablet Take 50 mg by mouth at bedtime as needed for sleep.    Yes Historical Provider, MD  Vitamin D, Ergocalciferol, (DRISDOL) 50000 UNITS CAPS capsule Take 50,000 Units by mouth every 7 (seven) days. Tuesdays. D/C after 8 weeks.   Yes Historical Provider, MD   BP 123/62 mmHg  Pulse 75  Temp(Src) 97.9 F (36.6 C) (Oral)  Resp 18  SpO2 96% Physical Exam  Constitutional: She is oriented to person, place, and time. She appears well-developed and well-nourished.  HENT:  Head: Normocephalic and atraumatic.  Eyes: EOM are normal. Pupils are equal, round, and reactive to light.  Neck: Neck supple.  No midline c-spine tenderness  Cardiovascular: Normal rate and regular rhythm.   Pulmonary/Chest: Effort normal and breath sounds normal. No respiratory distress. She exhibits no tenderness.  Abdominal: Soft. Bowel sounds are normal. She exhibits no distension. There is no tenderness.  Musculoskeletal: She exhibits edema.  No long bone tenderness - upper and lower extrmeities and no pelvic pain, instability.  Neurological: She is alert and oriented to person, place, and time. No cranial nerve deficit.  Skin: Skin is warm and dry. No rash noted.  Nursing note and vitals  reviewed.   ED Course  Procedures (including critical care time) Labs Review Labs Reviewed  URINALYSIS, ROUTINE W REFLEX MICROSCOPIC (NOT AT [facility])    Imaging Review Ct Head Wo Contrast  04/25/2015   CLINICAL DATA:  Fall out of bed.  EXAM: CT HEAD WITHOUT CONTRAST  CT CERVICAL SPINE WITHOUT CONTRAST  TECHNIQUE: Multidetector CT imaging of the head and cervical spine was performed following the standard protocol without intravenous contrast. Multiplanar CT image reconstructions of the cervical spine were also generated.  COMPARISON:  Three days prior 04/22/2015  FINDINGS: CT HEAD FINDINGS  No intracranial hemorrhage, mass effect, or midline shift. No hydrocephalus. The basilar cisterns are patent. No evidence of territorial infarct. No intracranial fluid collection. Extensive atrophy and chronic small vessel ischemic change, stable from prior. Calvarium is intact. Included paranasal sinuses and mastoid air cells are well aerated.  CT CERVICAL SPINE FINDINGS  Cervical spine alignment is maintained. Vertebral body heights are preserved. There is no fracture. The dens is intact. There are no jumped or perched facets. Degenerative disc disease and facet arthropathy throughout cervical spine is unchanged from prior. No prevertebral soft tissue edema.  IMPRESSION: 1.  No acute intracranial abnormality or calvarial fracture. 2. No acute fracture or subluxation of cervical spine.   Electronically Signed   By: Rubye Oaks M.D.   On: 04/25/2015 03:06   Ct Cervical Spine Wo Contrast  04/25/2015   CLINICAL DATA:  Fall out of bed.  EXAM: CT HEAD WITHOUT CONTRAST  CT CERVICAL SPINE WITHOUT CONTRAST  TECHNIQUE: Multidetector CT imaging of the head and cervical spine was performed following the standard protocol without intravenous contrast. Multiplanar CT image reconstructions of the cervical spine were also generated.  COMPARISON:  Three days prior 04/22/2015  FINDINGS: CT HEAD FINDINGS  No intracranial  hemorrhage, mass effect, or midline shift. No hydrocephalus. The basilar cisterns are patent. No evidence of territorial infarct. No intracranial fluid collection. Extensive atrophy and chronic small vessel ischemic change, stable from prior. Calvarium is intact. Included paranasal sinuses and mastoid air cells are well aerated.  CT CERVICAL SPINE FINDINGS  Cervical spine alignment is maintained. Vertebral body heights are preserved. There is no fracture. The dens is intact. There are no jumped or perched facets. Degenerative disc disease and facet arthropathy throughout cervical spine is unchanged from prior. No prevertebral soft tissue edema.  IMPRESSION: 1.  No acute intracranial abnormality or calvarial fracture. 2. No acute fracture or subluxation of cervical spine.   Electronically Signed   By: Rubye Oaks M.D.   On: 04/25/2015 03:06     EKG Interpretation None      MDM   Final diagnoses:  Fall   Pt comes in post fall. She appears to have recurrent falls. Head to toe exam reveals no acute deformities, bleeding. She is able to get up and bear weight, but is unsteady. Pt from nursing home, will d/c.    Derwood Kaplan, MD 04/25/15 0630

## 2015-04-25 NOTE — Discharge Instructions (Signed)
We saw you in the ER after you had a fall. °All the imaging results are normal, no fractures seen. No evidence of brain bleed. °Please be very careful with walking, and do everything possible to prevent falls. ° ° °Fall Prevention and Home Safety °Falls cause injuries and can affect all age groups. It is possible to use preventive measures to significantly decrease the likelihood of falls. There are many simple measures which can make your home safer and prevent falls. °OUTDOORS °· Repair cracks and edges of walkways and driveways. °· Remove high doorway thresholds. °· Trim shrubbery on the main path into your home. °· Have good outside lighting. °· Clear walkways of tools, rocks, debris, and clutter. °· Check that handrails are not broken and are securely fastened. Both sides of steps should have handrails. °· Have leaves, snow, and ice cleared regularly. °· Use sand or salt on walkways during winter months. °· In the garage, clean up grease or oil spills. °BATHROOM °· Install night lights. °· Install grab bars by the toilet and in the tub and shower. °· Use non-skid mats or decals in the tub or shower. °· Place a plastic non-slip stool in the shower to sit on, if needed. °· Keep floors dry and clean up all water on the floor immediately. °· Remove soap buildup in the tub or shower on a regular basis. °· Secure bath mats with non-slip, double-sided rug tape. °· Remove throw rugs and tripping hazards from the floors. °BEDROOMS °· Install night lights. °· Make sure a bedside light is easy to reach. °· Do not use oversized bedding. °· Keep a telephone by your bedside. °· Have a firm chair with side arms to use for getting dressed. °· Remove throw rugs and tripping hazards from the floor. °KITCHEN °· Keep handles on pots and pans turned toward the center of the stove. Use back burners when possible. °· Clean up spills quickly and allow time for drying. °· Avoid walking on wet floors. °· Avoid hot utensils and  knives. °· Position shelves so they are not too high or low. °· Place commonly used objects within easy reach. °· If necessary, use a sturdy step stool with a grab bar when reaching. °· Keep electrical cables out of the way. °· Do not use floor polish or wax that makes floors slippery. If you must use wax, use non-skid floor wax. °· Remove throw rugs and tripping hazards from the floor. °STAIRWAYS °· Never leave objects on stairs. °· Place handrails on both sides of stairways and use them. Fix any loose handrails. Make sure handrails on both sides of the stairways are as long as the stairs. °· Check carpeting to make sure it is firmly attached along stairs. Make repairs to worn or loose carpet promptly. °· Avoid placing throw rugs at the top or bottom of stairways, or properly secure the rug with carpet tape to prevent slippage. Get rid of throw rugs, if possible. °· Have an electrician put in a light switch at the top and bottom of the stairs. °OTHER FALL PREVENTION TIPS °· Wear low-heel or rubber-soled shoes that are supportive and fit well. Wear closed toe shoes. °· When using a stepladder, make sure it is fully opened and both spreaders are firmly locked. Do not climb a closed stepladder. °· Add color or contrast paint or tape to grab bars and handrails in your home. Place contrasting color strips on first and last steps. °· Learn and use mobility aids as   needed. Install an electrical emergency response system. °· Turn on lights to avoid dark areas. Replace light bulbs that burn out immediately. Get light switches that glow. °· Arrange furniture to create clear pathways. Keep furniture in the same place. °· Firmly attach carpet with non-skid or double-sided tape. °· Eliminate uneven floor surfaces. °· Select a carpet pattern that does not visually hide the edge of steps. °· Be aware of all pets. °OTHER HOME SAFETY TIPS °· Set the water temperature for 120° F (48.8° C). °· Keep emergency numbers on or near the  telephone. °· Keep smoke detectors on every level of the home and near sleeping areas. °Document Released: 10/31/2002 Document Revised: 05/11/2012 Document Reviewed: 01/30/2012 °[employer]® Patient Information ©2015 [employer]. This information is not intended to replace advice given to you by your health care provider. Make sure you discuss any questions you have with your health care provider. ° °

## 2015-04-25 NOTE — ED Notes (Signed)
Bed: [id number] Expected date: 04/24/15 Expected time: 11:37 PM Means of arrival: Ambulance Comments: Fall, 81 yr old

## 2015-04-25 NOTE — ED Notes (Signed)
Per EMS pt from [facility], pt fell out of bed onto a mat on the floor, pt here to be evaluated, bil rib pain, pt does have alzheimer's. Was here last week for same complaints, no head/back/neck pain.

## 2015-04-25 NOTE — ED Notes (Signed)
When asked pt if she was in pain she states "oh you know my bones hurt sometimes, but my bowels are good", pt in no distress.

## 2015-04-25 NOTE — ED Notes (Signed)
Patient transported to CT 

## 2015-04-25 NOTE — ED Notes (Signed)
[facility] called [facility]

## 2015-05-02 ENCOUNTER — Telehealth: Payer: Self-pay | Admitting: Internal Medicine

## 2015-05-02 ENCOUNTER — Ambulatory Visit (INDEPENDENT_AMBULATORY_CARE_PROVIDER_SITE_OTHER): Payer: Medicare Other | Admitting: Internal Medicine

## 2015-05-02 ENCOUNTER — Encounter: Payer: Self-pay | Admitting: Internal Medicine

## 2015-05-02 VITALS — BP 100/52 | HR 91

## 2015-05-02 DIAGNOSIS — J449 Chronic obstructive pulmonary disease, unspecified: Secondary | ICD-10-CM

## 2015-05-02 DIAGNOSIS — J9611 Chronic respiratory failure with hypoxia: Secondary | ICD-10-CM

## 2015-05-02 DIAGNOSIS — F0390 Unspecified dementia without behavioral disturbance: Secondary | ICD-10-CM | POA: Diagnosis not present

## 2015-05-02 MED ORDER — IPRATROPIUM-ALBUTEROL 0.5-2.5 (3) MG/3ML IN SOLN: 3.0000 mL | Freq: Four times a day (QID) | RESPIRATORY_TRACT | Status: AC

## 2015-05-02 NOTE — Telephone Encounter (Signed)
Fine with me

## 2015-05-02 NOTE — Telephone Encounter (Signed)
[facility] needs order for oxygen ASAP.  Patient needs oxygen to be delivered today, but no order has been sent in.  [facility] requesting O2 Sats on patient: (RA amb, RA rest, O2 amb, O2 rest), I do not see these numbers in patient's chart.  Do not see Oximetry in chart from last OV.   Order Needs to be faxed Attention: Angie @ [facility]  To Verlon AuLeslie:  Do you recall if this patient has been tested for O2 sats?  I do not see in chart.  Please follow up on this.  THanks.

## 2015-05-02 NOTE — Telephone Encounter (Signed)
lmtcb for Kim  

## 2015-05-02 NOTE — Telephone Encounter (Signed)
Qualifying sats added to problem list  [facility]

## 2015-05-02 NOTE — Telephone Encounter (Signed)
Morrie Sheldonshley [phone]

## 2015-05-02 NOTE — Telephone Encounter (Signed)
Spoke with Selena BattenKim, states that the facility that pt is living in has been started on 02 and provider has adjusted meds.  Pt is wanting a nurse to come out and do a respiratory assessment 1 time weekly X3 weeks as well as assess medications.   Kim needs a verbal order for this, can either leave a voicemail or leave a message with another nurse if she is unavailable.   MW please advise if you are ok with this.  Thanks!

## 2015-05-02 NOTE — Telephone Encounter (Signed)
Called and spoke to [place]. Nothing further needed as pt's sats are in pt's chart. Will sign off.

## 2015-05-02 NOTE — Telephone Encounter (Signed)
Left detailed message on voicemail for Kim with verbal orders.  ASked that kim call us back if she has any questions. Nothing further needed.

## 2015-05-02 NOTE — Patient Instructions (Addendum)
Stop spiriva   D/c albuterol and start   duoneb to qid until cough/ congestion better then qid as needed thereafter   Change 02 to 2lpm 24/7    Pulmonary follow up as needed

## 2015-05-02 NOTE — Telephone Encounter (Signed)
Kim returning call. Please CB at same number before

## 2015-05-02 NOTE — Progress Notes (Signed)
Subjective:     Patient ID: [name], female   DOB: [birth year],   MRN: [id number]  HPI  89 yowf quit smoking 1990's with ? Asthma as child but no need for any resp  meds as adult now living in Memory unit with pna rx with Levaquin finished April 26 2015 per [name] but continued to need 02 rx and seen in pulmonary clinic 05/02/2015 for new respiratory failure   05/02/2015 1st [name]   Chief Complaint  Patient presents with  . Pulmonary Consult    Referred by Reuel BoomLisa Daniel [name]- NP. Pt's care manager states pt is needing her o2 more. Breathing bothers her when she lies flat. She had PNA approx 2 wks ago.    she is back to baseline except now has 02 dependency whereas has used prn in past. No apparent swallowing problem but per staff has to be reminded when to cough and can't use IS effectively due to progressive decline and son / POA wishes for NCB status in this setting.  When does cough it sounds coarse and junky on spiriva and  prn saba neb   No obvious day to day or daytime variabilty or assoc   cp or chest tightness, subjective wheeze overt sinus or hb symptoms. No unusual exp hx or h/o childhood pna/ asthma or knowledge of premature birth.  Sleeping ok without nocturnal  or early am exacerbation  of respiratory  c/o's or need for noct saba. Also denies any obvious fluctuation of symptoms with weather or environmental changes or other aggravating or alleviating factors except as outlined above   Current Medications, Allergies, Complete Past Medical History, Past Surgical History, Family History, and Social History were reviewed in [facility] electronic medical record.  ROS  The following are not active complaints unless bolded sore throat, dysphagia, dental problems, itching, sneezing,  nasal congestion or excess/ purulent secretions, ear ache,   fever, chills, sweats, unintended wt loss, pleuritic or exertional cp, hemoptysis,  orthopnea pnd  or leg swelling, presyncope, palpitations, heartburn, abdominal pain, anorexia, nausea, vomiting, diarrhea  or change in bowel or urinary habits, change in stools or urine, dysuria,hematuria,  rash, arthralgias, visual complaints, headache, numbness weakness or ataxia or problems with walking or coordination,  change in mood/affect or memory.            Review of Systems     Objective:   Physical Exam    W/c bound elderly wf   Wt Readings from Last 3 Encounters:  04/22/15 138 lb (62.596 kg)  03/27/15 138 lb (62.596 kg)  09/17/11 135 lb (61.236 kg)    Vital signs reviewed   HEENT: nl dentition, turbinates, and orophanx. Nl external ear canals without cough reflex   NECK :  without JVD/Nodes/TM/ nl carotid upstrokes bilaterally   LUNGS: no acc muscle use, distant bs with minimal bilateral rhonchi   CV:  RRR  no s3 or murmur or increase in P2, no edema   ABD:  soft and nontender with nl excursion in the supine position. No bruits or organomegaly, bowel sounds nl  MS:  warm without deformities, calf tenderness, cyanosis or clubbing  SKIN: warm and dry without lesions    NEURO:  alert, approp, no deficits      I personally reviewed images and agree with radiology impression as follows:  CXR:  04/22/15 Similarly elevated RIGHT hemidiaphragm and bronchitic changes. Age indeterminate thoracolumbar compression fractures.  Assessment:

## 2015-05-03 ENCOUNTER — Emergency Department (HOSPITAL_COMMUNITY)
Admission: EM | Admit: 2015-05-03 | Discharge: 2015-05-03 | Disposition: A | Discharge status: Home / Self Care | Payer: Medicare Other | Attending: Emergency Medicine | Admitting: Emergency Medicine

## 2015-05-03 ENCOUNTER — Encounter (HOSPITAL_COMMUNITY): Payer: Self-pay | Admitting: Emergency Medicine

## 2015-05-03 ENCOUNTER — Encounter: Payer: Self-pay | Admitting: Internal Medicine

## 2015-05-03 ENCOUNTER — Emergency Department (HOSPITAL_COMMUNITY): Payer: Medicare Other

## 2015-05-03 DIAGNOSIS — Z8701 Personal history of pneumonia (recurrent): Secondary | ICD-10-CM | POA: Diagnosis not present

## 2015-05-03 DIAGNOSIS — J441 Chronic obstructive pulmonary disease with (acute) exacerbation: Secondary | ICD-10-CM | POA: Diagnosis not present

## 2015-05-03 DIAGNOSIS — Z79899 Other long term (current) drug therapy: Secondary | ICD-10-CM | POA: Diagnosis not present

## 2015-05-03 DIAGNOSIS — E119 Type 2 diabetes mellitus without complications: Secondary | ICD-10-CM | POA: Diagnosis not present

## 2015-05-03 DIAGNOSIS — R2243 Localized swelling, mass and lump, lower limb, bilateral: Secondary | ICD-10-CM | POA: Insufficient documentation

## 2015-05-03 DIAGNOSIS — Z872 Personal history of diseases of the skin and subcutaneous tissue: Secondary | ICD-10-CM | POA: Insufficient documentation

## 2015-05-03 DIAGNOSIS — Z8719 Personal history of other diseases of the digestive system: Secondary | ICD-10-CM | POA: Diagnosis not present

## 2015-05-03 DIAGNOSIS — F0390 Unspecified dementia without behavioral disturbance: Secondary | ICD-10-CM | POA: Diagnosis not present

## 2015-05-03 DIAGNOSIS — J449 Chronic obstructive pulmonary disease, unspecified: Secondary | ICD-10-CM | POA: Insufficient documentation

## 2015-05-03 DIAGNOSIS — J9611 Chronic respiratory failure with hypoxia: Secondary | ICD-10-CM | POA: Insufficient documentation

## 2015-05-03 DIAGNOSIS — F329 Major depressive disorder, single episode, unspecified: Secondary | ICD-10-CM | POA: Insufficient documentation

## 2015-05-03 DIAGNOSIS — Z8742 Personal history of other diseases of the female genital tract: Secondary | ICD-10-CM | POA: Insufficient documentation

## 2015-05-03 DIAGNOSIS — Z8669 Personal history of other diseases of the nervous system and sense organs: Secondary | ICD-10-CM | POA: Diagnosis not present

## 2015-05-03 DIAGNOSIS — M1990 Unspecified osteoarthritis, unspecified site: Secondary | ICD-10-CM | POA: Insufficient documentation

## 2015-05-03 DIAGNOSIS — F419 Anxiety disorder, unspecified: Secondary | ICD-10-CM | POA: Diagnosis not present

## 2015-05-03 DIAGNOSIS — R0602 Shortness of breath: Secondary | ICD-10-CM | POA: Diagnosis present

## 2015-05-03 LAB — BASIC METABOLIC PANEL
Anion gap: 9 (ref 5–15)
BUN: 13 mg/dL (ref 6–20)
CALCIUM: 8.5 mg/dL — AB (ref 8.9–10.3)
CO2: 29 mmol/L (ref 22–32)
CREATININE: 0.71 mg/dL (ref 0.44–1.00)
Chloride: 103 mmol/L (ref 101–111)
GFR calc Af Amer: >60 mL/min (ref >60)
GFR calc non Af Amer: >60 mL/min (ref >60)
Glucose, Bld: 126 mg/dL — ABNORMAL HIGH (ref 65–99)
POTASSIUM: 4.3 mmol/L (ref 3.5–5.1)
SODIUM: 141 mmol/L (ref 135–145)

## 2015-05-03 LAB — CBC WITH DIFFERENTIAL/PLATELET
Basophils Absolute: 0 10*3/uL (ref 0.0–0.1)
Basophils Relative: 0 % (ref 0–1)
EOS ABS: 0.1 10*3/uL (ref 0.0–0.7)
EOS PCT: 1 % (ref 0–5)
HEMATOCRIT: 44.1 % (ref 36.0–46.0)
Hemoglobin: 14.7 g/dL (ref 12.0–15.0)
LYMPHS ABS: 1.4 10*3/uL (ref 0.7–4.0)
LYMPHS PCT: 15 % (ref 12–46)
MCH: 32.5 pg (ref 26.0–34.0)
MCHC: 33.3 g/dL (ref 30.0–36.0)
MCV: 97.6 fL (ref 78.0–100.0)
MONO ABS: 2.1 10*3/uL — AB (ref 0.1–1.0)
MONOS PCT: 22 % — AB (ref 3–12)
Neutro Abs: 6 10*3/uL (ref 1.7–7.7)
Neutrophils Relative %: 62 % (ref 43–77)
PLATELETS: 125 10*3/uL — AB (ref 150–400)
RBC: 4.52 MIL/uL (ref 3.87–5.11)
RDW: 15.3 % (ref 11.5–15.5)
WBC: 9.6 10*3/uL (ref 4.0–10.5)

## 2015-05-03 LAB — URINALYSIS, ROUTINE W REFLEX MICROSCOPIC
Bilirubin Urine: NEGATIVE
Glucose, UA: NEGATIVE mg/dL
Hgb urine dipstick: NEGATIVE
Ketones, ur: NEGATIVE mg/dL
Leukocytes, UA: NEGATIVE
NITRITE: NEGATIVE
Protein, ur: NEGATIVE mg/dL
SPECIFIC GRAVITY, URINE: 1.02 (ref 1.005–1.030)
Urobilinogen, UA: 1 mg/dL (ref 0.0–1.0)
pH: 6.5 (ref 5.0–8.0)

## 2015-05-03 LAB — TROPONIN I: Troponin I: <0.03 ng/mL (ref <0.031)

## 2015-05-03 LAB — BRAIN NATRIURETIC PEPTIDE: B Natriuretic Peptide: 159.3 pg/mL — ABNORMAL HIGH (ref 0.0–100.0)

## 2015-05-03 LAB — HEPATIC FUNCTION PANEL
ALBUMIN: 2.9 g/dL — AB (ref 3.5–5.0)
ALT: 17 U/L (ref 14–54)
AST: 25 U/L (ref 15–41)
Alkaline Phosphatase: 128 U/L — ABNORMAL HIGH (ref 38–126)
BILIRUBIN INDIRECT: 0.3 mg/dL (ref 0.3–0.9)
Bilirubin, Direct: 0.2 mg/dL (ref 0.1–0.5)
Total Bilirubin: 0.5 mg/dL (ref 0.3–1.2)
Total Protein: 6.1 g/dL — ABNORMAL LOW (ref 6.5–8.1)

## 2015-05-03 LAB — I-STAT CG4 LACTIC ACID, ED: LACTIC ACID, VENOUS: 2.72 mmol/L — AB (ref 0.5–2.0)

## 2015-05-03 MED ORDER — IPRATROPIUM-ALBUTEROL 0.5-2.5 (3) MG/3ML IN SOLN
3.0000 mL | RESPIRATORY_TRACT | Status: DC
  Administered 2015-05-03: 3 mL via RESPIRATORY_TRACT
  Filled 2015-05-03: qty 3

## 2015-05-03 NOTE — ED Notes (Signed)
[facility] will be transporting pt back to [facility]. Called report to [facility]. Pt stable and ready for transport/Discharge. Pt' dtr at bedside

## 2015-05-03 NOTE — Assessment & Plan Note (Signed)
Progressive/ still recognizes fm member but took me multiple tries to get her to tell me "who is this sitting next to you" (her son)  rx per primary care

## 2015-05-03 NOTE — ED Notes (Signed)
[facility] arrived for transport 

## 2015-05-03 NOTE — ED Notes (Signed)
Pt breathing comfortably on 2L. Pt resting in bed. Nonlabored breathing

## 2015-05-03 NOTE — Discharge Instructions (Signed)
Chronic Obstructive Pulmonary Disease °Chronic obstructive pulmonary disease (COPD) is a common lung condition in which airflow from the lungs is limited. COPD is a general term that can be used to describe many different lung problems that limit airflow, including both chronic bronchitis and emphysema.  If you have COPD, your lung function will probably never return to normal, but there are measures you can take to improve lung function and make yourself feel better.  °CAUSES  °· Smoking (common).   °· Exposure to secondhand smoke.   °· Genetic problems. °· Chronic inflammatory lung diseases or recurrent infections. °SYMPTOMS  °· Shortness of breath, especially with physical activity.   °· Deep, persistent (chronic) cough with a large amount of thick mucus.   °· Wheezing.   °· Rapid breaths (tachypnea).   °· Gray or bluish discoloration (cyanosis) of the skin, especially in fingers, toes, or lips.   °· Fatigue.   °· Weight loss.   °· Frequent infections or episodes when breathing symptoms become much worse (exacerbations).   °· Chest tightness. °DIAGNOSIS  °Your health care provider will take a medical history and perform a physical examination to make the initial diagnosis.  Additional tests for COPD may include:  °· Lung (pulmonary) function tests. °· Chest X-ray. °· CT scan. °· Blood tests. °TREATMENT  °Treatment available to help you feel better when you have COPD includes:  °· Inhaler and nebulizer medicines. These help manage the symptoms of COPD and make your breathing more comfortable. °· Supplemental oxygen. Supplemental oxygen is only helpful if you have a low oxygen level in your blood.   °· Exercise and physical activity. These are beneficial for nearly all people with COPD. Some people may also benefit from a pulmonary rehabilitation program. °HOME CARE INSTRUCTIONS  °· Take all medicines (inhaled or pills) as directed by your health care provider. °· Avoid over-the-counter medicines or cough syrups  that dry up your airway (such as antihistamines) and slow down the elimination of secretions unless instructed otherwise by your health care provider.   °· If you are a smoker, the most important thing that you can do is stop smoking. Continuing to smoke will cause further lung damage and breathing trouble. Ask your health care provider for help with quitting smoking. He or she can direct you to community resources or hospitals that provide support. °· Avoid exposure to irritants such as smoke, chemicals, and fumes that aggravate your breathing. °· Use oxygen therapy and pulmonary rehabilitation if directed by your health care provider. If you require home oxygen therapy, ask your health care provider whether you should purchase a pulse oximeter to measure your oxygen level at home.   °· Avoid contact with individuals who have a contagious illness. °· Avoid extreme temperature and humidity changes. °· Eat healthy foods. Eating smaller, more frequent meals and resting before meals may help you maintain your strength. °· Stay active, but balance activity with periods of rest. Exercise and physical activity will help you maintain your ability to do things you want to do. °· Preventing infection and hospitalization is very important when you have COPD. Make sure to receive all the vaccines your health care provider recommends, especially the pneumococcal and influenza vaccines. Ask your health care provider whether you need a pneumonia vaccine. °· Learn and use relaxation techniques to manage stress. °· Learn and use controlled breathing techniques as directed by your health care provider. Controlled breathing techniques include:   °· Pursed lip breathing. Start by breathing in (inhaling) through your nose for 1 second. Then, purse your lips as if you were   going to whistle and breathe out (exhale) through the pursed lips for 2 seconds.   Diaphragmatic breathing. Start by putting one hand on your abdomen just above  your waist. Inhale slowly through your nose. The hand on your abdomen should move out. Then purse your lips and exhale slowly. You should be able to feel the hand on your abdomen moving in as you exhale.   Learn and use controlled coughing to clear mucus from your lungs. Controlled coughing is a series of short, progressive coughs. The steps of controlled coughing are:   Lean your head slightly forward.   Breathe in deeply using diaphragmatic breathing.   Try to hold your breath for 3 seconds.   Keep your mouth slightly open while coughing twice.   Spit any mucus out into a tissue.   Rest and repeat the steps once or twice as needed. SEEK MEDICAL CARE IF:   You are coughing up more mucus than usual.   There is a change in the color or thickness of your mucus.   Your breathing is more labored than usual.   Your breathing is faster than usual.  SEEK IMMEDIATE MEDICAL CARE IF:   You have shortness of breath while you are resting.   You have shortness of breath that prevents you from:  Being able to talk.   Performing your usual physical activities.   You have chest pain lasting longer than 5 minutes.   Your skin color is more cyanotic than usual.  You measure low oxygen saturations for longer than 5 minutes with a pulse oximeter. MAKE SURE YOU:   Understand these instructions.  Will watch your condition.  Will get help right away if you are not doing well or get worse. Document Released: 08/20/2005 Document Revised: 03/27/2014 Document Reviewed: 07/07/2013 [facility] Patient Information 2015 [place], [place]. This information is not intended to replace advice given to you by your health care provider. Make sure you discuss any questions you have with your health care provider. Dementia Dementia is a general term for problems with brain function. A person with dementia has memory loss and a hard time with at least one other brain function such as thinking,  speaking, or problem solving. Dementia can affect social functioning, how you do your job, your mood, or your personality. The changes may be hidden for a long time. The earliest forms of this disease are usually not detected by family or friends. Dementia can be:  Irreversible.  Potentially reversible.  Partially reversible.  Progressive. This means it can get worse over time. CAUSES  Irreversible dementia causes may include:  Degeneration of brain cells (Alzheimer disease or Lewy body dementia).  Multiple small strokes (vascular dementia).  Infection (chronic meningitis or Creutzfeldt-Jakob disease).  Frontotemporal dementia. This affects younger people, age 9 to 38, compared to those who have Alzheimer disease.  Dementia associated with other disorders like Parkinson disease, Huntington disease, or HIV-associated dementia. Potentially or partially reversible dementia causes may include:  Medicines.  Metabolic causes such as excessive alcohol intake, vitamin B12 deficiency, or thyroid disease.  Masses or pressure in the brain such as a tumor, blood clot, or hydrocephalus. SIGNS AND SYMPTOMS  Symptoms are often hard to detect. Family members or coworkers may not notice them early in the disease process. Different people with dementia may have different symptoms. Symptoms can include:  A hard time with memory, especially recent memory. Long-term memory may not be impaired.  Asking the same question multiple times or forgetting  something someone just said.  A hard time speaking your thoughts or finding certain words.  A hard time solving problems or performing familiar tasks (such as how to use a telephone).  Sudden changes in mood.  Changes in personality, especially increasing moodiness or mistrust.  Depression.  A hard time understanding complex ideas that were never a problem in the past. DIAGNOSIS  There are no specific tests for dementia.   Your health care  provider may recommend a thorough evaluation. This is because some forms of dementia can be reversible. The evaluation will likely include a physical exam and getting a detailed history from you and a family member. The history often gives the best clues and suggestions for a diagnosis.  Memory testing may be done. A detailed brain function evaluation called neuropsychologic testing may be helpful.  Lab tests and brain imaging (such as a CT scan or MRI scan) are sometimes important.  Sometimes observation and re-evaluation over time is very helpful. TREATMENT  Treatment depends on the cause.   If the problem is a vitamin deficiency, it may be helped or cured with supplements.  For dementias such as Alzheimer disease, medicines are available to stabilize or slow the course of the disease. There are no cures for this type of dementia.  Your health care provider can help direct you to groups, organizations, and other health care providers to help with decisions in the care of you or your loved one. HOME CARE INSTRUCTIONS The care of individuals with dementia is varied and dependent upon the progression of the dementia. The following suggestions are intended for the person living with, or caring for, the person with dementia.  Create a safe environment.  Remove the locks on bathroom doors to prevent the person from accidentally locking himself or herself in.  Use childproof latches on kitchen cabinets and any place where cleaning supplies, chemicals, or alcohol are kept.  Use childproof covers in unused electrical outlets.  Install childproof devices to keep doors and windows secured.  Remove stove knobs or install safety knobs and an automatic shut-off on the stove.  Lower the temperature on water heaters.  Label medicines and keep them locked up.  Secure knives, lighters, matches, power tools, and guns, and keep these items out of reach.  Keep the house free from clutter. Remove  rugs or anything that might contribute to a fall.  Remove objects that might break and hurt the person.  Make sure lighting is good, both inside and outside.  Install grab rails as needed.  Use a monitoring device to alert you to falls or other needs for help.  Reduce confusion.  Keep familiar objects and people around.  Use night lights or dim lights at night.  Label items or areas.  Use reminders, notes, or directions for daily activities or tasks.  Keep a simple, consistent routine for waking, meals, bathing, dressing, and bedtime.  Create a calm, quiet environment.  Place large clocks and calendars prominently.  Display emergency numbers and home address near all telephones.  Use cues to establish different times of the day. An example is to open curtains to let the natural light in during the day.   Use effective communication.  Choose simple words and short sentences.  Use a gentle, calm tone of voice.  Be careful not to interrupt.  If the person is struggling to find a word or communicate a thought, try to provide the word or thought.  Ask one question at a  time. Allow the person ample time to answer questions. Repeat the question again if the person does not respond.  Reduce nighttime restlessness.  Provide a comfortable bed.  Have a consistent nighttime routine.  Ensure a regular walking or physical activity schedule. Involve the person in daily activities as much as possible.  Limit napping during the day.  Limit caffeine.  Attend social events that stimulate rather than overwhelm the senses.  Encourage good nutrition and hydration.  Reduce distractions during meal times and snacks.  Avoid foods that are too hot or too cold.  Monitor chewing and swallowing ability.  Continue with routine vision, hearing, dental, and medical screenings.  Give medicines only as directed by the health care provider.  Monitor driving abilities. Do not allow  the person to drive when safe driving is no longer possible.  Register with an identification program which could provide location assistance in the event of a missing person situation. SEEK MEDICAL CARE IF:   New behavioral problems start such as moodiness, aggressiveness, or seeing things that are not there (hallucinations).  Any new problem with brain function happens. This includes problems with balance, speech, or falling a lot.  Problems with swallowing develop.  Any symptoms of other illness happen. Small changes or worsening in any aspect of brain function can be a sign that the illness is getting worse. It can also be a sign of another medical illness such as infection. Seeing a health care provider right away is important. SEEK IMMEDIATE MEDICAL CARE IF:   A fever develops.  New or worsened confusion develops.  New or worsened sleepiness develops.  Staying awake becomes hard to do. Document Released: 05/06/2001 Document Revised: 03/27/2014 Document Reviewed: 04/07/2011 [facility] Patient Information 2015 [place], [place]. This information is not intended to replace advice given to you by your health care provider. Make sure you discuss any questions you have with your health care provider.

## 2015-05-03 NOTE — Assessment & Plan Note (Signed)
No need for further w/u > duoneb adequate for now as can't use spiriva effectivel

## 2015-05-03 NOTE — ED Notes (Signed)
Pt from [facility]. Pt has CHF, DNR. Staff reports pt having periods of apnea (however EMS reported to RN that pt has hx of some brain condition that causes her to have these periods of apnea). Pt states she just feels sleepy- no complaints of SOB. Pt is on a 10 day lasix regiment and has 3 days left for her increased pedal edema. CBG 112, BP 110/60, HR 60 Sinus rhythm. EMS placed on 4L [place]- pt normally wears 2L  at facility, unable to pick up O2 sats. Due to fingers being cyanotic per EMS

## 2015-05-03 NOTE — Assessment & Plan Note (Addendum)
I had an extended discussion with the patient and staff  reviewing all relevant studies completed to date and  lasting  35 min   1) the copd/ asthmatic bronchitic  component of the problem is the least of my concerns but I doubt she can use spiriva effectively and should be just treated with duoneb qid for now and perhaps change to prn if improves  2) the much greater issue is cognitive decline where she really doesn't have good cough mechanics and I doubt she's swallowing as well as perceived (if she doesn't know she needs to cough then likely suffering microasp of secretions and or food) and son firmly against peg/ trach and all about quality of life at this point   3) formal swallowing study can be done at some point if desired but even if passes at some level my concern about cough won't change  4) recurrent pna/ worsening sats likely > Discussed in detail all the  indications, usual  risks and alternatives  relative to the benefits with patient's son who agrees to proceed with full NCB status/ paperwork completed.  02 supplementation is ok at present levels Pulmonary f/u can be prn

## 2015-05-03 NOTE — ED Provider Notes (Signed)
CSN: [id number]     Arrival date & time 05/03/15  4098 History   First MD Initiated Contact with Patient 05/03/15 1003     Chief Complaint  Patient presents with  . Shortness of Breath     (Consider location/radiation/quality/duration/timing/severity/associated sxs/prior Treatment) HPI The patient is brought in from [facility] with report of increased difficulty breathing and difficulty taking her DuoNeb. Reportedly she had hypoxia and required an increase in her baseline 2 L of nasal cannula to 4 L. Her daughter also notes that for several weeks she's been having increasing swelling of her feet. The patient does endorse some sensation of congestion in her chest. She denies active chest pain. There is no report of documented fever. She was recently treated for pneumonia and finished antibiotics within the past 7 days. Review of Dr. Maretta Los note indicates concern for some ongoing aspiration and poor cough mechanism with advancing dementia. As of yesterday's note his counsel was for increasing oxygen to continuous and DuoNeb's every 4 hours. Past Medical History  Diagnosis Date  . Fistula     COLOVAGINAL  . Diverticulitis   . Other chronic cystitis   . Dysuria   . Incomplete bladder emptying   . Nocturia   . Postmenopausal atrophic vaginitis   . Other specified symptom associated with female genital organs   . Anxiety state, unspecified   . Personal history of arthritis   . Unspecified asthma(493.90)   . Chronic airway obstruction, not elsewhere classified   . Other persistent mental disorders due to conditions classified elsewhere   . Depressive disorder, not elsewhere classified   . Type II or unspecified type diabetes mellitus without mention of complication, not stated as uncontrolled   . Esophageal reflux   . Diaphragmatic hernia without mention of obstruction or gangrene   . Insomnia, unspecified   . Other chronic cystitis   . Postmenopausal atrophic vaginitis   . Dysuria    . Dementia    Past Surgical History  Procedure Laterality Date  . Appendectomy    . Hernia repair    . Cataract surgery    . Nissen fundoplication    . Hemorrhoid surgery    . Cystoscopy    . Left kidney sturgery for kidney stones    . Flexible sigmoidoscopy  09/17/2011    Procedure: FLEXIBLE SIGMOIDOSCOPY;  Surgeon: Malissa Hippo, MD;  Location: [facility];  Service: Endoscopy;  Laterality: N/A;  8:30 am   No family history on file. History  Substance Use Topics  . Smoking status: Never Smoker   . Smokeless tobacco: Not on file  . Alcohol Use: No   OB History    No data available     Review of Systems Patient cannot provide review of systems due to level V caveat of dementia.   Allergies  Lactose intolerance (gi); Ciprocin-fluocin-procin; Corticosteroids; Nitrofurantoin; Oxybutynin; Quinine sulfate; and Sulfa antibiotics  Home Medications   Prior to Admission medications   Medication Sig Start Date End Date Taking? Authorizing Provider  acetaminophen (TYLENOL) 500 MG tablet Take 500 mg by mouth every 6 (six) hours as needed for mild pain, fever or headache.   Yes Historical Provider, MD  ALPRAZolam (XANAX) 0.25 MG tablet Take 0.25 mg by mouth 2 (two) times daily.   Yes Historical Provider, MD  alum & mag hydroxide-simeth (MAALOX/MYLANTA) 200-200-20 MG/5ML suspension Take 30 mLs by mouth every 6 (six) hours as needed for indigestion or heartburn.   Yes Historical Provider, MD  dicyclomine (  BENTYL) 20 MG tablet Take 1 tablet by mouth 3 (three) times daily. 02/26/15  Yes Historical Provider, MD  divalproex (DEPAKOTE SPRINKLE) 125 MG capsule Take 500 mg by mouth 2 (two) times daily before lunch and supper.    Yes Historical Provider, MD  furosemide (LASIX) 20 MG tablet Take 20 mg by mouth daily. For 5 days then dc.   Yes Historical Provider, MD  guaifenesin (ROBITUSSIN) 100 MG/5ML syrup Take 200 mg by mouth 3 (three) times daily as needed for cough.   Yes Historical  Provider, MD  ipratropium-albuterol (DUONEB) 0.5-2.5 (3) MG/3ML SOLN Take 3 mLs by nebulization 4 (four) times daily. 05/02/15  Yes Nyoka Cowden, MD  loperamide (IMODIUM) 2 MG capsule Take 2 mg by mouth as needed for diarrhea or loose stools.   Yes Historical Provider, MD  magnesium hydroxide (MILK OF MAGNESIA) 400 MG/5ML suspension Take 30 mLs by mouth daily as needed for mild constipation.   Yes Historical Provider, MD  Melatonin 3 MG CAPS Take 9 mg by mouth at bedtime.    Yes Historical Provider, MD  metFORMIN (GLUCOPHAGE) 500 MG tablet Take 500 mg by mouth 2 (two) times daily with a meal.    Yes Historical Provider, MD  mirtazapine (REMERON) 30 MG tablet Take 30 mg by mouth at bedtime.   Yes Historical Provider, MD  neomycin-bacitracin-polymyxin (NEOSPORIN) [phone] ointment Apply 1 application topically 4 (four) times daily as needed (skin tears/abrasions).   Yes Historical Provider, MD  sertraline (ZOLOFT) 50 MG tablet Take 50 mg by mouth daily.   Yes Historical Provider, MD  Skin Protectants, Misc. (BAZA PROTECT EX) Apply 1 application topically 3 (three) times daily. Applied to sacrum   Yes Historical Provider, MD  temazepam (RESTORIL) 15 MG capsule Take 15 mg by mouth at bedtime.    Yes Historical Provider, MD  tiotropium (SPIRIVA) 18 MCG inhalation capsule Place 18 mcg into inhaler and inhale daily.   Yes Historical Provider, MD  Vitamin D, Ergocalciferol, (DRISDOL) 50000 UNITS CAPS capsule Take 50,000 Units by mouth every 7 (seven) days. Tuesdays. D/C after 8 weeks.   Yes Historical Provider, MD   BP 102/50 mmHg  Pulse 77  Temp(Src) 98.6 F (37 C) (Oral)  Resp 15  SpO2 94% Physical Exam  Constitutional: She appears well-developed and well-nourished.  Patient is resting with eyes closed. She does not have acute respiratory distress. She awakens to voice to add commentary to the history. She does have a debilitated appearance.  HENT:  Head: Normocephalic and atraumatic.   Mouth/Throat: Oropharynx is clear and moist.  Eyes: EOM are normal. Pupils are equal, round, and reactive to light.  Neck: Neck supple.  Cardiovascular: Normal rate, regular rhythm, normal heart sounds and intact distal pulses.   Pulmonary/Chest: Effort normal.  Decreased breath sounds at the right base. Otherwise adequate air flow through left long and right lung to the mid region. No gross wheeze, rhonchi or rail.  Abdominal: Soft. Bowel sounds are normal. She exhibits no distension. There is no tenderness.  Musculoskeletal: Normal range of motion. She exhibits edema.  2+ edema bilateral lower extremities to below the knees. Skin has some superficial erythematous lesions however no frank venous stasis ulcers or open wounds.  Neurological: She is alert. She has normal strength. Coordination normal. GCS eye subscore is 4. GCS verbal subscore is 5. GCS motor subscore is 6.  Patient is somewhat somnolent. She does however awaken to provide some historical content. She has cysts and following commands to  sit up and move in the stretcher. There is no localizing motor dysfunction of the extremities.  Skin: Skin is warm, dry and intact.  Psychiatric: She has a normal mood and affect.    ED Course  Procedures (including critical care time) Labs Review Labs Reviewed  BASIC METABOLIC PANEL - Abnormal; Notable for the following:    Glucose, Bld 126 (*)    Calcium 8.5 (*)    All other components within normal limits  CBC WITH DIFFERENTIAL/PLATELET - Abnormal; Notable for the following:    Platelets 125 (*)    Monocytes Relative 22 (*)    Monocytes Absolute 2.1 (*)    All other components within normal limits  HEPATIC FUNCTION PANEL - Abnormal; Notable for the following:    Total Protein 6.1 (*)    Albumin 2.9 (*)    Alkaline Phosphatase 128 (*)    All other components within normal limits  BRAIN NATRIURETIC PEPTIDE - Abnormal; Notable for the following:    B Natriuretic Peptide 159.3 (*)     All other components within normal limits  I-STAT CG4 LACTIC ACID, ED - Abnormal; Notable for the following:    Lactic Acid, Venous 2.72 (*)    All other components within normal limits  CULTURE, BLOOD (ROUTINE X 2)  CULTURE, BLOOD (ROUTINE X 2)  TROPONIN I  URINALYSIS, ROUTINE W REFLEX MICROSCOPIC (NOT AT [facility])    Imaging Review Dg Chest 2 View  05/03/2015   CLINICAL DATA:  Shortness of breath.  EXAM: CHEST  2 VIEW  COMPARISON:  04/22/2015, 04/18/2015, and 03/27/2015  FINDINGS: Heart size and pulmonary vascularity are normal and the lungs are clear. Tortuosity and calcification of the thoracic aorta. Chronic elevation of the right hemidiaphragm. Large hiatal hernia. Multiple old compression fractures.  IMPRESSION: No acute abnormalities.   Electronically Signed   By: Francene Boyers M.D.   On: 05/03/2015 11:25     EKG Interpretation   Date/Time:  Thursday May 03 2015 10:20:10 EDT Ventricular Rate:  92 PR Interval:  135 QRS Duration: 79 QT Interval:  348 QTC Calculation: 430 R Axis:   23 Text Interpretation:  Sinus rhythm Low voltage, extremity leads agree.  possible lateral ST depression, mild, compared with old. Confirmed by  Donnald Garre, MD, Lebron Conners [phone]) on 05/03/2015 10:57:08 AM      MDM   Final diagnoses:  COPD exacerbation  Chronic respiratory failure with hypoxia   The patient's daughter reports she has seemed at her baseline since here in the hospital with her. She reports that she's been resting and then awakening to normal level of interaction. Her oxygen saturation on prescribed 2 L supplemental oxygen has been stable. The patient does not show acute respiratory distress. Diagnostic evaluation does not indicate decompensated congestive heart failure, pneumonia by chest x-ray or leukocytosis or acute MI. Patient has a mild elevation in lactate however this does not correspond to any objective source. As the patient does retain fluid and is being treated with Lasix did not  feel additional fluid administration was appropriate as she otherwise does not show clinical signs or laboratory signs of volume depletion. The patient will be returned to nursing home care where she will have ongoing monitoring and treatment.    Arby Barrette, MD 05/03/15 1315

## 2015-05-09 LAB — CULTURE, BLOOD (ROUTINE X 2)
CULTURE: NO GROWTH
Culture: NO GROWTH

## 2015-05-18 ENCOUNTER — Emergency Department (HOSPITAL_COMMUNITY)
Admission: EM | Admit: 2015-05-18 | Discharge: 2015-05-19 | Disposition: A | Discharge status: Home / Self Care | Payer: Medicare Other | Attending: Emergency Medicine | Admitting: Emergency Medicine

## 2015-05-18 ENCOUNTER — Emergency Department (HOSPITAL_COMMUNITY): Payer: Medicare Other

## 2015-05-18 ENCOUNTER — Encounter (HOSPITAL_COMMUNITY): Payer: Self-pay | Admitting: Emergency Medicine

## 2015-05-18 DIAGNOSIS — E119 Type 2 diabetes mellitus without complications: Secondary | ICD-10-CM | POA: Insufficient documentation

## 2015-05-18 DIAGNOSIS — Y998 Other external cause status: Secondary | ICD-10-CM | POA: Insufficient documentation

## 2015-05-18 DIAGNOSIS — Z8719 Personal history of other diseases of the digestive system: Secondary | ICD-10-CM | POA: Insufficient documentation

## 2015-05-18 DIAGNOSIS — Z79899 Other long term (current) drug therapy: Secondary | ICD-10-CM | POA: Diagnosis not present

## 2015-05-18 DIAGNOSIS — J449 Chronic obstructive pulmonary disease, unspecified: Secondary | ICD-10-CM | POA: Insufficient documentation

## 2015-05-18 DIAGNOSIS — F0390 Unspecified dementia without behavioral disturbance: Secondary | ICD-10-CM | POA: Diagnosis not present

## 2015-05-18 DIAGNOSIS — Y9389 Activity, other specified: Secondary | ICD-10-CM | POA: Diagnosis not present

## 2015-05-18 DIAGNOSIS — Z8742 Personal history of other diseases of the female genital tract: Secondary | ICD-10-CM | POA: Insufficient documentation

## 2015-05-18 DIAGNOSIS — Y92129 Unspecified place in nursing home as the place of occurrence of the external cause: Secondary | ICD-10-CM | POA: Diagnosis not present

## 2015-05-18 DIAGNOSIS — F329 Major depressive disorder, single episode, unspecified: Secondary | ICD-10-CM | POA: Diagnosis not present

## 2015-05-18 DIAGNOSIS — W1839XA Other fall on same level, initial encounter: Secondary | ICD-10-CM | POA: Diagnosis not present

## 2015-05-18 DIAGNOSIS — W19XXXA Unspecified fall, initial encounter: Secondary | ICD-10-CM

## 2015-05-18 DIAGNOSIS — S01111A Laceration without foreign body of right eyelid and periocular area, initial encounter: Secondary | ICD-10-CM | POA: Diagnosis not present

## 2015-05-18 DIAGNOSIS — F419 Anxiety disorder, unspecified: Secondary | ICD-10-CM | POA: Insufficient documentation

## 2015-05-18 DIAGNOSIS — S0993XA Unspecified injury of face, initial encounter: Secondary | ICD-10-CM | POA: Diagnosis present

## 2015-05-18 DIAGNOSIS — Z87448 Personal history of other diseases of urinary system: Secondary | ICD-10-CM | POA: Insufficient documentation

## 2015-05-18 DIAGNOSIS — R062 Wheezing: Secondary | ICD-10-CM

## 2015-05-18 MED ORDER — LORAZEPAM 2 MG/ML IJ SOLN
1.0000 mg | Freq: Once | INTRAMUSCULAR | Status: AC
  Administered 2015-05-19: 1 mg via INTRAMUSCULAR
  Filled 2015-05-18: qty 1

## 2015-05-18 MED ORDER — LIDOCAINE HCL 2 % IJ SOLN
20.0000 mL | Freq: Once | INTRAMUSCULAR | Status: DC
  Filled 2015-05-18: qty 20

## 2015-05-18 MED ORDER — HALOPERIDOL LACTATE 5 MG/ML IJ SOLN: 5.0000 mg | Freq: Once | INTRAMUSCULAR | Status: DC

## 2015-05-18 MED ORDER — HALOPERIDOL LACTATE 5 MG/ML IJ SOLN
5.0000 mg | Freq: Once | INTRAMUSCULAR | Status: AC
  Administered 2015-05-18: 5 mg via INTRAMUSCULAR
  Filled 2015-05-18: qty 1

## 2015-05-18 MED ORDER — TETANUS-DIPHTH-ACELL PERTUSSIS 5-2.5-18.5 LF-MCG/0.5 IM SUSP
0.5000 mL | Freq: Once | INTRAMUSCULAR | Status: AC
  Administered 2015-05-18: 0.5 mL via INTRAMUSCULAR
  Filled 2015-05-18: qty 0.5

## 2015-05-18 NOTE — Progress Notes (Signed)
CSW met with pt at bedside. There was no family present. Patient confirms that she presents to [facility] due to falling yesterday Patient states that her physician informed her to come to the emergency department tonight.. Also, patient confirms that she comes from [facility].  Patient informed CSW that while at the facility the staff assist her with completing her ADL's. Patient states that she does not fall often. Patient informed CSW that she feels as though she is at the correct level of care.  Patient appears to have a head injury.  Patient states that her primary support is her son Timmothy, who also lives in [place].  Willette Brace [phone] ED CSW 05/18/2015 11:07 PM

## 2015-05-18 NOTE — ED Notes (Signed)
Pt arrived via EMS from [facility].  Pt was in her wheel chair when she attempted to get up and fell.  Pt has a 2 cm laceration above the right eye. Pt also was given 5 mg of albuterol by EMS due to the patient having low saturation rates.  Pt has COPD.  Pt is poor historian due to her dementia.

## 2015-05-19 MED ORDER — CEPHALEXIN 500 MG PO CAPS: 500.0000 mg | ORAL_CAPSULE | Freq: Three times a day (TID) | ORAL | Status: DC

## 2015-05-19 NOTE — ED Provider Notes (Signed)
Care assumed from Dr. Gwendolyn Grant changes shift.  Patient was awaiting CT scans, otherwise is stable.  CT scans unremarkable.  Results for orders placed or performed during the hospital encounter of 05/03/15  Culture, blood (routine x 2)  Result Value Ref Range   Specimen Description BLOOD RIGHT ASSIST CONTROL    Special Requests BAA 5CC     Culture      NO GROWTH 5 DAYS Performed at [facility]    Report Status 05/09/2015 FINAL   Culture, blood (routine x 2)  Result Value Ref Range   Specimen Description BLOOD RIGHT HAND    Special Requests AEB    Culture      NO GROWTH 5 DAYS Performed at [facility]    Report Status 05/09/2015 FINAL   Basic metabolic panel  Result Value Ref Range   Sodium 141 135 - 145 mmol/L   Potassium 4.3 3.5 - 5.1 mmol/L   Chloride 103 101 - 111 mmol/L   CO2 29 22 - 32 mmol/L   Glucose, Bld 126 (H) 65 - 99 mg/dL   BUN 13 6 - 20 mg/dL   Creatinine, Ser 1.61 0.44 - 1.00 mg/dL   Calcium 8.5 (L) 8.9 - 10.3 mg/dL   GFR calc non Af Amer >60 >60 mL/min   GFR calc Af Amer >60 >60 mL/min   Anion gap 9 5 - 15  CBC WITH DIFFERENTIAL  Result Value Ref Range   WBC 9.6 4.0 - 10.5 K/uL   RBC 4.52 3.87 - 5.11 MIL/uL   Hemoglobin 14.7 12.0 - 15.0 g/dL   HCT 09.6 04.5 - 40.9 %   MCV 97.6 78.0 - 100.0 fL   MCH 32.5 26.0 - 34.0 pg   MCHC 33.3 30.0 - 36.0 g/dL   RDW 81.1 91.4 - 78.2 %   Platelets 125 (L) 150 - 400 K/uL   Neutrophils Relative % 62 43 - 77 %   Neutro Abs 6.0 1.7 - 7.7 K/uL   Lymphocytes Relative 15 12 - 46 %   Lymphs Abs 1.4 0.7 - 4.0 K/uL   Monocytes Relative 22 (H) 3 - 12 %   Monocytes Absolute 2.1 (H) 0.1 - 1.0 K/uL   Eosinophils Relative 1 0 - 5 %   Eosinophils Absolute 0.1 0.0 - 0.7 K/uL   Basophils Relative 0 0 - 1 %   Basophils Absolute 0.0 0.0 - 0.1 K/uL  Hepatic function panel  Result Value Ref Range   Total Protein 6.1 (L) 6.5 - 8.1 g/dL   Albumin 2.9 (L) 3.5 - 5.0 g/dL   AST 25 15 - 41 U/L   ALT 17 14 - 54 U/L    Alkaline Phosphatase 128 (H) 38 - 126 U/L   Total Bilirubin 0.5 0.3 - 1.2 mg/dL   Bilirubin, Direct 0.2 0.1 - 0.5 mg/dL   Indirect Bilirubin 0.3 0.3 - 0.9 mg/dL  Brain natriuretic peptide  Result Value Ref Range   B Natriuretic Peptide 159.3 (H) 0.0 - 100.0 pg/mL  Troponin I  Result Value Ref Range   Troponin I <0.03 <0.031 ng/mL  Urinalysis, Routine w reflex microscopic (not at [facility])  Result Value Ref Range   Color, Urine YELLOW YELLOW   APPearance CLEAR CLEAR   Specific Gravity, Urine 1.020 1.005 - 1.030   pH 6.5 5.0 - 8.0   Glucose, UA NEGATIVE NEGATIVE mg/dL   Hgb urine dipstick NEGATIVE NEGATIVE   Bilirubin Urine NEGATIVE NEGATIVE   Ketones, ur NEGATIVE NEGATIVE  mg/dL   Protein, ur NEGATIVE NEGATIVE mg/dL   Urobilinogen, UA 1.0 0.0 - 1.0 mg/dL   Nitrite NEGATIVE NEGATIVE   Leukocytes, UA NEGATIVE NEGATIVE  I-Stat CG4 Lactic Acid, ED  Result Value Ref Range   Lactic Acid, Venous 2.72 (HH) 0.5 - 2.0 mmol/L   Comment NOTIFIED PHYSICIAN    Dg Chest 2 View  05/19/2015   CLINICAL DATA:  Acute onset of wheezing.  Initial encounter.  EXAM: CHEST  2 VIEW  COMPARISON:  Chest radiograph performed 05/03/2015  FINDINGS: The lungs are hypoexpanded. There is marked elevation of the right hemidiaphragm. Underlying vascular crowding and vascular congestion are seen. Increased interstitial markings may reflect mild interstitial edema. No definite pleural effusion or pneumothorax is seen.  The cardiomediastinal silhouette remains borderline normal in size. No acute osseous abnormalities are identified.  IMPRESSION: Lungs hypoexpanded. Marked elevation of the right hemidiaphragm. Underlying vascular congestion noted. Increased interstitial markings may reflect mild interstitial edema.   Electronically Signed   By: Roanna Raider M.D.   On: 05/19/2015 01:25   Dg Chest 2 View  05/03/2015   CLINICAL DATA:  Shortness of breath.  EXAM: CHEST  2 VIEW  COMPARISON:  04/22/2015, 04/18/2015, and 03/27/2015   FINDINGS: Heart size and pulmonary vascularity are normal and the lungs are clear. Tortuosity and calcification of the thoracic aorta. Chronic elevation of the right hemidiaphragm. Large hiatal hernia. Multiple old compression fractures.  IMPRESSION: No acute abnormalities.   Electronically Signed   By: Francene Boyers M.D.   On: 05/03/2015 11:25   Dg Chest 2 View  04/22/2015   CLINICAL DATA:  Multiple falls, recent fall with neck pain. History of dementia.  EXAM: CHEST  2 VIEW  COMPARISON:  Chest radiograph Apr 18, 2015  FINDINGS: Cardiac silhouette is upper limits of normal in size, mediastinal silhouette is nonsuspicious, calcified aortic knob. Mitral annular calcifications. Similar bronchitic changes. Elevated RIGHT hemidiaphragm with colonic interpositioning. No pneumothorax.  Surgical clips projected GE junction. Age indeterminate single moderate upper thoracic and mild L2, L4 compression fractures. Patient is osteopenic.  IMPRESSION: Similarly elevated RIGHT hemidiaphragm and bronchitic changes.  Age indeterminate thoracolumbar compression fractures.   Electronically Signed   By: Awilda Metro M.D.   On: 04/22/2015 03:45   Dg Pelvis 1-2 Views  04/22/2015   CLINICAL DATA:  Multiple falls, recent fall with neck pain. History of dementia.  EXAM: PELVIS - 1-2 VIEW  COMPARISON:  Abdominal radiograph September 09, 2013  FINDINGS: No acute fracture deformity. Femoral heads are well formed and located. Remote LEFT pubic symphyseal fracture. Osteopenia. No destructive bony lesions. Ovoid sub cm calcification projecting LEFT abdomen could reflect contrast in a diverticulum or, granuloma. Mild vascular calcifications.  IMPRESSION: No acute fracture deformity or dislocation. Old LEFT pubic symphyseal fracture.  Osteopenia, which decreases sensitivity for acute nondisplaced fracture.   Electronically Signed   By: Awilda Metro M.D.   On: 04/22/2015 03:42   Ct Head Wo Contrast  05/19/2015   CLINICAL DATA:   Fall, laceration above right eye  EXAM: CT HEAD WITHOUT CONTRAST  TECHNIQUE: Contiguous axial images were obtained from the base of the skull through the vertex without intravenous contrast.  COMPARISON:  04/25/2015  FINDINGS: Motion degraded images.  No evidence of parenchymal hemorrhage or extra-axial fluid collection. No mass lesion, mass effect, or midline shift.  No CT evidence of acute infarction.  Subcortical white matter and periventricular small vessel ischemic changes. Intracranial atherosclerosis.  Global cortical and central atrophy.  Secondary  ventriculomegaly.  The visualized paranasal sinuses are essentially clear. The mastoid air cells are unopacified.  Soft tissue swelling/extracranial hematoma overlying the right frontal bone.  No evidence of calvarial fracture.  IMPRESSION: Motion degraded images.  Soft tissue swelling/extracranial hematoma overlying the right frontal bone. No evidence of calvarial fracture.  Atrophy with secondary ventriculomegaly. Small vessel ischemic changes.   Electronically Signed   By: Charline Bills M.D.   On: 05/19/2015 01:38   Ct Head Wo Contrast  04/25/2015   CLINICAL DATA:  Fall out of bed.  EXAM: CT HEAD WITHOUT CONTRAST  CT CERVICAL SPINE WITHOUT CONTRAST  TECHNIQUE: Multidetector CT imaging of the head and cervical spine was performed following the standard protocol without intravenous contrast. Multiplanar CT image reconstructions of the cervical spine were also generated.  COMPARISON:  Three days prior 04/22/2015  FINDINGS: CT HEAD FINDINGS  No intracranial hemorrhage, mass effect, or midline shift. No hydrocephalus. The basilar cisterns are patent. No evidence of territorial infarct. No intracranial fluid collection. Extensive atrophy and chronic small vessel ischemic change, stable from prior. Calvarium is intact. Included paranasal sinuses and mastoid air cells are well aerated.  CT CERVICAL SPINE FINDINGS  Cervical spine alignment is maintained. Vertebral  body heights are preserved. There is no fracture. The dens is intact. There are no jumped or perched facets. Degenerative disc disease and facet arthropathy throughout cervical spine is unchanged from prior. No prevertebral soft tissue edema.  IMPRESSION: 1.  No acute intracranial abnormality or calvarial fracture. 2. No acute fracture or subluxation of cervical spine.   Electronically Signed   By: Rubye Oaks M.D.   On: 04/25/2015 03:06   Ct Head Wo Contrast  04/22/2015   CLINICAL DATA:  Unwitnessed fall.  Neck pain.  EXAM: CT HEAD WITHOUT CONTRAST  CT CERVICAL SPINE WITHOUT CONTRAST  TECHNIQUE: Multidetector CT imaging of the head and cervical spine was performed following the standard protocol without intravenous contrast. Multiplanar CT image reconstructions of the cervical spine were also generated.  COMPARISON:  04/18/2015  FINDINGS: CT HEAD FINDINGS  Skull and Sinuses:Negative for fracture or destructive process. The mastoids, middle ears, and imaged paranasal sinuses are clear.  Orbits: Bilateral cataract resection.  No traumatic findings.  Brain: No evidence of acute infarction, hemorrhage, hydrocephalus, or mass lesion/mass effect. Generalized brain atrophy which is advanced and correlates with history of dementia. Prominent mesial temporal lobe involvement implies Alzheimer's disease. There is extensive chronic vessel disease with ischemic gliosis throughout the bilateral cerebral white matter.  CT CERVICAL SPINE FINDINGS  Negative for acute fracture or subluxation. No prevertebral edema. No gross cervical canal hematoma. Degenerative disc disease, with advanced disc narrowing and endplate spurring at C5-6, C6-7, and C7-T1. Facet arthropathy with overgrowth is greater in the upper cervical spine. No evidence for high-grade canal stenosis.  IMPRESSION: 1. No evidence of intracranial or cervical spine injury. 2. Involutional changes noted above.   Electronically Signed   By: Marnee Spring M.D.    On: 04/22/2015 03:22   Ct Cervical Spine Wo Contrast  04/25/2015   CLINICAL DATA:  Fall out of bed.  EXAM: CT HEAD WITHOUT CONTRAST  CT CERVICAL SPINE WITHOUT CONTRAST  TECHNIQUE: Multidetector CT imaging of the head and cervical spine was performed following the standard protocol without intravenous contrast. Multiplanar CT image reconstructions of the cervical spine were also generated.  COMPARISON:  Three days prior 04/22/2015  FINDINGS: CT HEAD FINDINGS  No intracranial hemorrhage, mass effect, or midline shift. No hydrocephalus. The basilar  cisterns are patent. No evidence of territorial infarct. No intracranial fluid collection. Extensive atrophy and chronic small vessel ischemic change, stable from prior. Calvarium is intact. Included paranasal sinuses and mastoid air cells are well aerated.  CT CERVICAL SPINE FINDINGS  Cervical spine alignment is maintained. Vertebral body heights are preserved. There is no fracture. The dens is intact. There are no jumped or perched facets. Degenerative disc disease and facet arthropathy throughout cervical spine is unchanged from prior. No prevertebral soft tissue edema.  IMPRESSION: 1.  No acute intracranial abnormality or calvarial fracture. 2. No acute fracture or subluxation of cervical spine.   Electronically Signed   By: Rubye Oaks M.D.   On: 04/25/2015 03:06   Ct Cervical Spine Wo Contrast  04/22/2015   CLINICAL DATA:  Unwitnessed fall.  Neck pain.  EXAM: CT HEAD WITHOUT CONTRAST  CT CERVICAL SPINE WITHOUT CONTRAST  TECHNIQUE: Multidetector CT imaging of the head and cervical spine was performed following the standard protocol without intravenous contrast. Multiplanar CT image reconstructions of the cervical spine were also generated.  COMPARISON:  04/18/2015  FINDINGS: CT HEAD FINDINGS  Skull and Sinuses:Negative for fracture or destructive process. The mastoids, middle ears, and imaged paranasal sinuses are clear.  Orbits: Bilateral cataract resection.   No traumatic findings.  Brain: No evidence of acute infarction, hemorrhage, hydrocephalus, or mass lesion/mass effect. Generalized brain atrophy which is advanced and correlates with history of dementia. Prominent mesial temporal lobe involvement implies Alzheimer's disease. There is extensive chronic vessel disease with ischemic gliosis throughout the bilateral cerebral white matter.  CT CERVICAL SPINE FINDINGS  Negative for acute fracture or subluxation. No prevertebral edema. No gross cervical canal hematoma. Degenerative disc disease, with advanced disc narrowing and endplate spurring at C5-6, C6-7, and C7-T1. Facet arthropathy with overgrowth is greater in the upper cervical spine. No evidence for high-grade canal stenosis.  IMPRESSION: 1. No evidence of intracranial or cervical spine injury. 2. Involutional changes noted above.   Electronically Signed   By: Marnee Spring M.D.   On: 04/22/2015 03:22      Marisa Severin, MD 05/19/15 [phone]

## 2015-05-19 NOTE — ED Provider Notes (Signed)
CSN: [id number]     Arrival date & time 05/18/15  2114 History   First MD Initiated Contact with Patient 05/18/15 2154     Chief Complaint  Patient presents with  . Fall     (Consider location/radiation/quality/duration/timing/severity/associated sxs/prior Treatment) HPI Comments: Larey Seat getting out of her wheelchair at the nursing home. Demented.  Patient is a 81 y.o. female presenting with fall. The history is provided by the patient and the EMS personnel.  Fall This is a new problem. The current episode started 1 to 2 hours ago. Episode frequency: once. The problem has not changed since onset.Pertinent negatives include no abdominal pain and no shortness of breath. Nothing aggravates the symptoms. Nothing relieves the symptoms.    Past Medical History  Diagnosis Date  . Fistula     COLOVAGINAL  . Diverticulitis   . Other chronic cystitis   . Dysuria   . Incomplete bladder emptying   . Nocturia   . Postmenopausal atrophic vaginitis   . Other specified symptom associated with female genital organs   . Anxiety state, unspecified   . Personal history of arthritis   . Unspecified asthma(493.90)   . Chronic airway obstruction, not elsewhere classified   . Other persistent mental disorders due to conditions classified elsewhere   . Depressive disorder, not elsewhere classified   . Type II or unspecified type diabetes mellitus without mention of complication, not stated as uncontrolled   . Esophageal reflux   . Diaphragmatic hernia without mention of obstruction or gangrene   . Insomnia, unspecified   . Other chronic cystitis   . Postmenopausal atrophic vaginitis   . Dysuria   . Dementia    Past Surgical History  Procedure Laterality Date  . Appendectomy    . Hernia repair    . Cataract surgery    . Nissen fundoplication    . Hemorrhoid surgery    . Cystoscopy    . Left kidney sturgery for kidney stones    . Flexible sigmoidoscopy  09/17/2011    Procedure: FLEXIBLE  SIGMOIDOSCOPY;  Surgeon: Malissa Hippo, MD;  Location: [facility];  Service: Endoscopy;  Laterality: N/A;  8:30 am   History reviewed. No pertinent family history. History  Substance Use Topics  . Smoking status: Never Smoker   . Smokeless tobacco: Not on file  . Alcohol Use: No   OB History    No data available     Review of Systems  Unable to perform ROS: Dementia  Constitutional: Negative for fever.  Respiratory: Negative for cough and shortness of breath.   Gastrointestinal: Negative for vomiting and abdominal pain.      Allergies  Lactose intolerance (gi); Ciprocin-fluocin-procin; Corticosteroids; Nitrofurantoin; Oxybutynin; Quinine sulfate; and Sulfa antibiotics  Home Medications   Prior to Admission medications   Medication Sig Start Date End Date Taking? Authorizing Provider  acetaminophen (TYLENOL) 500 MG tablet Take 500 mg by mouth every 6 (six) hours as needed for mild pain, fever or headache.   Yes Historical Provider, MD  ALPRAZolam (XANAX) 0.25 MG tablet Take 0.25 mg by mouth 2 (two) times daily.   Yes Historical Provider, MD  alum & mag hydroxide-simeth (MAALOX/MYLANTA) 200-200-20 MG/5ML suspension Take 30 mLs by mouth every 6 (six) hours as needed for indigestion or heartburn.   Yes Historical Provider, MD  dicyclomine (BENTYL) 20 MG tablet Take 1 tablet by mouth 3 (three) times daily. 02/26/15  Yes Historical Provider, MD  divalproex (DEPAKOTE SPRINKLE) 125 MG capsule Take 500  mg by mouth 2 (two) times daily before lunch and supper.    Yes Historical Provider, MD  guaifenesin (ROBITUSSIN) 100 MG/5ML syrup Take 200 mg by mouth 3 (three) times daily as needed for cough.   Yes Historical Provider, MD  ipratropium-albuterol (DUONEB) 0.5-2.5 (3) MG/3ML SOLN Take 3 mLs by nebulization 4 (four) times daily. 05/02/15  Yes Nyoka Cowden, MD  loperamide (IMODIUM) 2 MG capsule Take 2 mg by mouth as needed for diarrhea or loose stools.   Yes Historical Provider, MD    magnesium hydroxide (MILK OF MAGNESIA) 400 MG/5ML suspension Take 30 mLs by mouth daily as needed for mild constipation.   Yes Historical Provider, MD  Melatonin 3 MG CAPS Take 9 mg by mouth at bedtime.    Yes Historical Provider, MD  metFORMIN (GLUCOPHAGE) 500 MG tablet Take 500 mg by mouth 2 (two) times daily with a meal.    Yes Historical Provider, MD  mirtazapine (REMERON) 30 MG tablet Take 30 mg by mouth at bedtime.   Yes Historical Provider, MD  neomycin-bacitracin-polymyxin (NEOSPORIN) [phone] ointment Apply 1 application topically 4 (four) times daily as needed (skin tears/abrasions).   Yes Historical Provider, MD  sertraline (ZOLOFT) 50 MG tablet Take 50 mg by mouth daily.   Yes Historical Provider, MD  Skin Protectants, Misc. (BAZA PROTECT EX) Apply 1 application topically 3 (three) times daily. Applied to sacrum   Yes Historical Provider, MD  temazepam (RESTORIL) 15 MG capsule Take 15 mg by mouth at bedtime.    Yes Historical Provider, MD  Vitamin D, Ergocalciferol, (DRISDOL) 50000 UNITS CAPS capsule Take 50,000 Units by mouth every 7 (seven) days. Tuesdays. D/C after 8 weeks.   Yes Historical Provider, MD   BP 124/56 mmHg  Pulse 100  Temp(Src) 97.9 F (36.6 C) (Axillary)  Resp 18  SpO2 93% Physical Exam  Constitutional: She appears well-developed and well-nourished. No distress.  HENT:  Head: Normocephalic.    Mouth/Throat: Oropharynx is clear and moist.  Large 5 cm laceration  Eyes: EOM are normal. Pupils are equal, round, and reactive to light.  Neck: Normal range of motion. Neck supple.  Cardiovascular: Normal rate and regular rhythm.  Exam reveals no friction rub.   No murmur heard. Pulmonary/Chest: Effort normal and breath sounds normal. No respiratory distress. She has no wheezes. She has no rales.  Abdominal: Soft. She exhibits no distension. There is no tenderness. There is no rebound.  Musculoskeletal: Normal range of motion. She exhibits no edema.   Neurological: She is alert.  Skin: She is not diaphoretic.  Nursing note and vitals reviewed.   ED Course  LACERATION REPAIR Date/Time: 05/19/2015 12:44 AM Performed by: Elwin Mocha Authorized by: Elwin Mocha Consent: The procedure was performed in an emergent situation. Body area: head/neck Location details: right eyebrow Laceration length: 5 cm Foreign bodies: no foreign bodies Tendon involvement: none Nerve involvement: none Vascular damage: no Anesthesia: local infiltration Local anesthetic: lidocaine 2% without epinephrine Anesthetic total: 2 ml Patient sedated: yes Sedation type: anxiolysis Sedatives: lorazepam Preparation: Patient was prepped and draped in the usual sterile fashion. Debridement: none Degree of undermining: none Skin closure: 5-0 Prolene Number of sutures: 4 Technique: simple Approximation: close Approximation difficulty: complex Patient tolerance: Patient tolerated the procedure well with no immediate complications   (including critical care time) Labs Review Labs Reviewed - No data to display  Imaging Review Dg Chest 2 View  05/19/2015   CLINICAL DATA:  Acute onset of wheezing.  Initial encounter.  EXAM: CHEST  2 VIEW  COMPARISON:  Chest radiograph performed 05/03/2015  FINDINGS: The lungs are hypoexpanded. There is marked elevation of the right hemidiaphragm. Underlying vascular crowding and vascular congestion are seen. Increased interstitial markings may reflect mild interstitial edema. No definite pleural effusion or pneumothorax is seen.  The cardiomediastinal silhouette remains borderline normal in size. No acute osseous abnormalities are identified.  IMPRESSION: Lungs hypoexpanded. Marked elevation of the right hemidiaphragm. Underlying vascular congestion noted. Increased interstitial markings may reflect mild interstitial edema.   Electronically Signed   By: Roanna Raider M.D.   On: 05/19/2015 01:25   Ct Head Wo Contrast  05/19/2015    CLINICAL DATA:  Fall, laceration above right eye  EXAM: CT HEAD WITHOUT CONTRAST  TECHNIQUE: Contiguous axial images were obtained from the base of the skull through the vertex without intravenous contrast.  COMPARISON:  04/25/2015  FINDINGS: Motion degraded images.  No evidence of parenchymal hemorrhage or extra-axial fluid collection. No mass lesion, mass effect, or midline shift.  No CT evidence of acute infarction.  Subcortical white matter and periventricular small vessel ischemic changes. Intracranial atherosclerosis.  Global cortical and central atrophy.  Secondary ventriculomegaly.  The visualized paranasal sinuses are essentially clear. The mastoid air cells are unopacified.  Soft tissue swelling/extracranial hematoma overlying the right frontal bone.  No evidence of calvarial fracture.  IMPRESSION: Motion degraded images.  Soft tissue swelling/extracranial hematoma overlying the right frontal bone. No evidence of calvarial fracture.  Atrophy with secondary ventriculomegaly. Small vessel ischemic changes.   Electronically Signed   By: Charline Bills M.D.   On: 05/19/2015 01:38     EKG Interpretation None      MDM   Final diagnoses:  Fall  Wheezing    44-year-old female here with history of dementia from a nursing home. She fell on getting out of her wheelchair and sustained a laceration to 400. Plan for a head CT scan and repair of her laceration. She was hypoxic with EMS. She does have history of COPD. Will obtain a chest x-ray. She is satting well here and doing well without any respiratory distress after receiving a nebulizer en route. Patient given IM haldol for some confusion and sundowning. She also fell out of the bed once here thinking she was at home. Vest restraint placed. I attempted to repair her laceration, however she was too agitated, will give IM ativan.  Lac repair as above.  Dr. Norlene Campbell to await imaging results.  Elwin Mocha, MD 05/21/15 [phone]

## 2015-05-19 NOTE — Discharge Instructions (Signed)

## 2015-05-19 NOTE — ED Notes (Signed)
Per [employer], patient is next to be picked up by [facility]

## 2015-06-04 ENCOUNTER — Emergency Department (HOSPITAL_COMMUNITY)
Admission: EM | Admit: 2015-06-04 | Discharge: 2015-06-04 | Disposition: A | Discharge status: Home / Self Care | Payer: Medicare Other | Attending: Emergency Medicine | Admitting: Emergency Medicine

## 2015-06-04 ENCOUNTER — Emergency Department (HOSPITAL_COMMUNITY): Payer: Medicare Other

## 2015-06-04 ENCOUNTER — Encounter (HOSPITAL_COMMUNITY): Payer: Self-pay

## 2015-06-04 DIAGNOSIS — N390 Urinary tract infection, site not specified: Secondary | ICD-10-CM | POA: Insufficient documentation

## 2015-06-04 DIAGNOSIS — F0390 Unspecified dementia without behavioral disturbance: Secondary | ICD-10-CM | POA: Insufficient documentation

## 2015-06-04 DIAGNOSIS — Y998 Other external cause status: Secondary | ICD-10-CM | POA: Insufficient documentation

## 2015-06-04 DIAGNOSIS — W050XXA Fall from non-moving wheelchair, initial encounter: Secondary | ICD-10-CM | POA: Insufficient documentation

## 2015-06-04 DIAGNOSIS — Z79899 Other long term (current) drug therapy: Secondary | ICD-10-CM | POA: Insufficient documentation

## 2015-06-04 DIAGNOSIS — W19XXXA Unspecified fall, initial encounter: Secondary | ICD-10-CM

## 2015-06-04 DIAGNOSIS — G4700 Insomnia, unspecified: Secondary | ICD-10-CM | POA: Insufficient documentation

## 2015-06-04 DIAGNOSIS — Y9389 Activity, other specified: Secondary | ICD-10-CM | POA: Insufficient documentation

## 2015-06-04 DIAGNOSIS — Z043 Encounter for examination and observation following other accident: Secondary | ICD-10-CM | POA: Diagnosis not present

## 2015-06-04 DIAGNOSIS — E119 Type 2 diabetes mellitus without complications: Secondary | ICD-10-CM | POA: Insufficient documentation

## 2015-06-04 DIAGNOSIS — Z8719 Personal history of other diseases of the digestive system: Secondary | ICD-10-CM | POA: Insufficient documentation

## 2015-06-04 DIAGNOSIS — Z8742 Personal history of other diseases of the female genital tract: Secondary | ICD-10-CM | POA: Diagnosis not present

## 2015-06-04 DIAGNOSIS — F329 Major depressive disorder, single episode, unspecified: Secondary | ICD-10-CM | POA: Diagnosis not present

## 2015-06-04 DIAGNOSIS — J449 Chronic obstructive pulmonary disease, unspecified: Secondary | ICD-10-CM | POA: Diagnosis not present

## 2015-06-04 DIAGNOSIS — F419 Anxiety disorder, unspecified: Secondary | ICD-10-CM | POA: Insufficient documentation

## 2015-06-04 DIAGNOSIS — Z8739 Personal history of other diseases of the musculoskeletal system and connective tissue: Secondary | ICD-10-CM | POA: Insufficient documentation

## 2015-06-04 DIAGNOSIS — Y9289 Other specified places as the place of occurrence of the external cause: Secondary | ICD-10-CM | POA: Diagnosis not present

## 2015-06-04 DIAGNOSIS — R8271 Bacteriuria: Secondary | ICD-10-CM

## 2015-06-04 DIAGNOSIS — R8281 Pyuria: Secondary | ICD-10-CM

## 2015-06-04 LAB — URINALYSIS, ROUTINE W REFLEX MICROSCOPIC
Glucose, UA: NEGATIVE mg/dL
Ketones, ur: NEGATIVE mg/dL
Nitrite: NEGATIVE
PROTEIN: NEGATIVE mg/dL
Specific Gravity, Urine: 1.02 (ref 1.005–1.030)
Urobilinogen, UA: 1 mg/dL (ref 0.0–1.0)
pH: 5.5 (ref 5.0–8.0)

## 2015-06-04 LAB — CBG MONITORING, ED: Glucose-Capillary: 75 mg/dL (ref 65–99)

## 2015-06-04 LAB — URINE MICROSCOPIC-ADD ON

## 2015-06-04 MED ORDER — CEPHALEXIN 250 MG PO CAPS
500.0000 mg | ORAL_CAPSULE | Freq: Once | ORAL | Status: AC
Start: 2015-06-04 — End: 2015-06-04
  Administered 2015-06-04: 500 mg via ORAL

## 2015-06-04 MED ORDER — CEPHALEXIN 500 MG PO CAPS: 500.0000 mg | ORAL_CAPSULE | Freq: Two times a day (BID) | ORAL | Status: AC

## 2015-06-04 NOTE — ED Notes (Signed)
[facility] for [facility] unwitnessed fall from a wheelchair.  Per EMS, staff states pt was in the wheelchair, staff left her alone and when they came back to check on her, pt was on the floor.  No visible injury noted, pt is at her baseline per staff and no trauma visible.

## 2015-06-04 NOTE — ED Provider Notes (Signed)
CSN: [id number]     Arrival date & time 06/04/15  2111 History   First MD Initiated Contact with Patient 06/04/15 2112     Chief Complaint  Patient presents with  . Fall    LEVEL 5 CAVEAT SECONDARY TO DEMENTIA  (Consider location/radiation/quality/duration/timing/severity/associated sxs/prior Treatment) HPI Comments: 81 year old female with a history of dementia, COPD, and diabetes mellitus presents to the emergency department for further evaluation after an unwitnessed fall from a wheelchair. Per EMS, staff report the patient was in the wheelchair when she was left alone. When they came back to get the patient she was on the floor. Patient has no complaints of pain at this time. She is pleasant and conversant. Patient is not on blood thinners. She is a DO NOT RESUSCITATE.  Patient is a 81 y.o. female presenting with fall. The history is provided by the patient. No language interpreter was used.  Fall This is a new problem. The current episode started today. The problem has been unchanged.    Past Medical History  Diagnosis Date  . Fistula     COLOVAGINAL  . Diverticulitis   . Other chronic cystitis   . Dysuria   . Incomplete bladder emptying   . Nocturia   . Postmenopausal atrophic vaginitis   . Other specified symptom associated with female genital organs   . Anxiety state, unspecified   . Personal history of arthritis   . Unspecified asthma(493.90)   . Chronic airway obstruction, not elsewhere classified   . Other persistent mental disorders due to conditions classified elsewhere   . Depressive disorder, not elsewhere classified   . Type II or unspecified type diabetes mellitus without mention of complication, not stated as uncontrolled   . Esophageal reflux   . Diaphragmatic hernia without mention of obstruction or gangrene   . Insomnia, unspecified   . Other chronic cystitis   . Postmenopausal atrophic vaginitis   . Dysuria   . Dementia    Past Surgical History    Procedure Laterality Date  . Appendectomy    . Hernia repair    . Cataract surgery    . Nissen fundoplication    . Hemorrhoid surgery    . Cystoscopy    . Left kidney sturgery for kidney stones    . Flexible sigmoidoscopy  09/17/2011    Procedure: FLEXIBLE SIGMOIDOSCOPY;  Surgeon: Malissa HippoNajeeb U Rehman, MD;  Location: [facility];  Service: Endoscopy;  Laterality: N/A;  8:30 am   No family history on file. History  Substance Use Topics  . Smoking status: Never Smoker   . Smokeless tobacco: Not on file  . Alcohol Use: No   OB History    No data available      Review of Systems  Unable to perform ROS: Dementia  No c/o pain, per patient   Allergies  Lactose intolerance (gi); Ciprocin-fluocin-procin; Corticosteroids; Nitrofurantoin; Oxybutynin; Quinine sulfate; and Sulfa antibiotics  Home Medications   Prior to Admission medications   Medication Sig Start Date End Date Taking? Authorizing Provider  acetaminophen (TYLENOL) 500 MG tablet Take 500 mg by mouth every 6 (six) hours as needed for mild pain, fever or headache.    Historical Provider, MD  ALPRAZolam Prudy Feeler(XANAX) 0.25 MG tablet Take 0.25 mg by mouth 2 (two) times daily.    Historical Provider, MD  alum & mag hydroxide-simeth (MAALOX/MYLANTA) 200-200-20 MG/5ML suspension Take 30 mLs by mouth every 6 (six) hours as needed for indigestion or heartburn.    Historical Provider, MD  cephALEXin (KEFLEX) 500 MG capsule Take 1 capsule (500 mg total) by mouth 2 (two) times daily. 06/04/15   Antony Madura, PA-C  dicyclomine (BENTYL) 20 MG tablet Take 1 tablet by mouth 3 (three) times daily. 02/26/15   Historical Provider, MD  divalproex (DEPAKOTE SPRINKLE) 125 MG capsule Take 500 mg by mouth 2 (two) times daily before lunch and supper.     Historical Provider, MD  guaifenesin (ROBITUSSIN) 100 MG/5ML syrup Take 200 mg by mouth 3 (three) times daily as needed for cough.    Historical Provider, MD  ipratropium-albuterol (DUONEB) 0.5-2.5 (3) MG/3ML  SOLN Take 3 mLs by nebulization 4 (four) times daily. 05/02/15   Nyoka Cowden, MD  loperamide (IMODIUM) 2 MG capsule Take 2 mg by mouth as needed for diarrhea or loose stools.    Historical Provider, MD  magnesium hydroxide (MILK OF MAGNESIA) 400 MG/5ML suspension Take 30 mLs by mouth daily as needed for mild constipation.    Historical Provider, MD  Melatonin 3 MG CAPS Take 9 mg by mouth at bedtime.     Historical Provider, MD  metFORMIN (GLUCOPHAGE) 500 MG tablet Take 500 mg by mouth 2 (two) times daily with a meal.     Historical Provider, MD  mirtazapine (REMERON) 30 MG tablet Take 30 mg by mouth at bedtime.    Historical Provider, MD  neomycin-bacitracin-polymyxin (NEOSPORIN) [phone] ointment Apply 1 application topically 4 (four) times daily as needed (skin tears/abrasions).    Historical Provider, MD  sertraline (ZOLOFT) 50 MG tablet Take 50 mg by mouth daily.    Historical Provider, MD  Skin Protectants, Misc. (BAZA PROTECT EX) Apply 1 application topically 3 (three) times daily. Applied to sacrum    Historical Provider, MD  temazepam (RESTORIL) 15 MG capsule Take 15 mg by mouth at bedtime.     Historical Provider, MD  Vitamin D, Ergocalciferol, (DRISDOL) 50000 UNITS CAPS capsule Take 50,000 Units by mouth every 7 (seven) days. Tuesdays. D/C after 8 weeks.    Historical Provider, MD   BP 123/72 mmHg  Pulse 83  Temp(Src) 98.2 F (36.8 C) (Oral)  Resp 16  SpO2 100%   Physical Exam  Constitutional: She appears well-developed and well-nourished. No distress.  Alert, pleasant female in no distress  HENT:  Head: Normocephalic and atraumatic.    Mouth/Throat: Abnormal dentition (grossly poor dentition).  No skull instability, battle's sign, or racoon's eyes  Eyes: Conjunctivae and EOM are normal. No scleral icterus.  Neck: Normal range of motion.  Cardiovascular: Normal rate, regular rhythm and intact distal pulses.   Pulmonary/Chest: Effort normal. No respiratory distress. She has  no wheezes.  Diffuse coarse expiratory breath sounds. No tachypnea or dyspnea. Chest expansion symmetric.  Abdominal: Soft. She exhibits no distension. There is no tenderness. There is no rebound.  Soft, nontender  Musculoskeletal: Normal range of motion.  No pitting edema present. No hematomas or contusions to trunk or extremities on exam. No bony deformities, step offs, or crepitus to C/T/L spine.  Neurological: She is alert.  Patient moving all extremities.  Skin: Skin is warm and dry. No rash noted. She is not diaphoretic. No erythema. No pallor.  Psychiatric: She has a normal mood and affect. Her behavior is normal.  Nursing note and vitals reviewed.   ED Course  Procedures (including critical care time) Labs Review Labs Reviewed  URINALYSIS, ROUTINE W REFLEX MICROSCOPIC (NOT AT [facility]) - Abnormal; Notable for the following:    Color, Urine AMBER (*)  APPearance CLOUDY (*)    Hgb urine dipstick SMALL (*)    Bilirubin Urine SMALL (*)    Leukocytes, UA SMALL (*)    All other components within normal limits  URINE MICROSCOPIC-ADD ON - Abnormal; Notable for the following:    Bacteria, UA MANY (*)    All other components within normal limits  URINE CULTURE  CBG MONITORING, ED    Imaging Review Dg Chest 2 View  06/04/2015   CLINICAL DATA:  Fall. Patient was found on the floor today. No visible injury.  EXAM: CHEST  2 VIEW  COMPARISON:  05/19/2015  FINDINGS: Shallow inspiration with significant elevation of the right hemidiaphragm. Colonic interposition under the hemidiaphragm on the right. Appearance is similar to prior study. Probable linear atelectasis in both lung bases. No focal consolidation. Heart size and pulmonary vascularity are normal. Mediastinal contours appear intact. Calcified and tortuous aorta. No pneumothorax. Surgical clips in the left upper quadrant. Degenerative changes in the spine.  IMPRESSION: Shallow inspiration with prominent elevation of the right  hemidiaphragm. Atelectasis in the lung bases. Appearance is similar to prior study.   Electronically Signed   By: Burman Nieves M.D.   On: 06/04/2015 22:01   Dg Pelvis 1-2 Views  06/04/2015   CLINICAL DATA:  81 year old female with a history of fall.  EXAM: PELVIS - 1-2 VIEW  COMPARISON:  04/22/2015  FINDINGS: Chronic changes of prior trauma involving the left superior and inferior pubic ramus, with callus formation.  No acute fracture identified.  Bilateral hips projects normally over the acetabula. Visualized aspects of the proximal femurs unremarkable.  Vascular calcifications.  IMPRESSION: No acute bony abnormality identified.  Posttraumatic changes of the left superior and inferior pubic ramus, similar to comparison.  Signed,  Yvone Neu. Loreta Ave, DO  [facility]  [facility]   Electronically Signed   By: Gilmer Mor D.O.   On: 06/04/2015 22:00     EKG Interpretation None      MDM   Final diagnoses:  Fall  Bacteriuria with pyuria    81 year old female with history of dementia presents to the emergency department for evaluation of an unwitnessed fall. No evidence of acute trauma on exam. No skull instability or hematoma. X-ray of chest and pelvis show no acute changes. Urinalysis with many bacteria and mild pyuria. Will cover for UTI. No indication for further workup. Patient stable for discharge back to her nursing facility.   Filed Vitals:   06/04/15 2057 06/04/15 2119 06/04/15 2132  BP:  98/48 123/72  Pulse:  49 83  Temp:  98.2 F (36.8 C)   TempSrc:  Oral   Resp:  20 16  SpO2: 98% 85% 100%     Antony Madura, PA-C 06/04/15 2253  Eber Hong, MD 06/04/15 2332

## 2015-06-04 NOTE — Discharge Instructions (Signed)

## 2015-06-04 NOTE — ED Notes (Signed)
[facility]

## 2015-06-05 ENCOUNTER — Encounter (HOSPITAL_COMMUNITY): Payer: Self-pay | Admitting: *Deleted

## 2015-06-05 ENCOUNTER — Emergency Department (HOSPITAL_COMMUNITY)
Admission: EM | Admit: 2015-06-05 | Discharge: 2015-06-05 | Disposition: A | Discharge status: Home / Self Care | Payer: Medicare Other | Attending: Emergency Medicine | Admitting: Emergency Medicine

## 2015-06-05 DIAGNOSIS — F0280 Dementia in other diseases classified elsewhere without behavioral disturbance: Secondary | ICD-10-CM | POA: Insufficient documentation

## 2015-06-05 DIAGNOSIS — M1990 Unspecified osteoarthritis, unspecified site: Secondary | ICD-10-CM | POA: Diagnosis not present

## 2015-06-05 DIAGNOSIS — Y939 Activity, unspecified: Secondary | ICD-10-CM | POA: Diagnosis not present

## 2015-06-05 DIAGNOSIS — G309 Alzheimer's disease, unspecified: Secondary | ICD-10-CM | POA: Insufficient documentation

## 2015-06-05 DIAGNOSIS — Y92129 Unspecified place in nursing home as the place of occurrence of the external cause: Secondary | ICD-10-CM | POA: Insufficient documentation

## 2015-06-05 DIAGNOSIS — F0281 Dementia in other diseases classified elsewhere with behavioral disturbance: Secondary | ICD-10-CM | POA: Insufficient documentation

## 2015-06-05 DIAGNOSIS — Z87448 Personal history of other diseases of urinary system: Secondary | ICD-10-CM | POA: Diagnosis not present

## 2015-06-05 DIAGNOSIS — F329 Major depressive disorder, single episode, unspecified: Secondary | ICD-10-CM | POA: Insufficient documentation

## 2015-06-05 DIAGNOSIS — S51019A Laceration without foreign body of unspecified elbow, initial encounter: Secondary | ICD-10-CM

## 2015-06-05 DIAGNOSIS — Z79899 Other long term (current) drug therapy: Secondary | ICD-10-CM | POA: Insufficient documentation

## 2015-06-05 DIAGNOSIS — W1839XA Other fall on same level, initial encounter: Secondary | ICD-10-CM | POA: Insufficient documentation

## 2015-06-05 DIAGNOSIS — K219 Gastro-esophageal reflux disease without esophagitis: Secondary | ICD-10-CM | POA: Diagnosis not present

## 2015-06-05 DIAGNOSIS — F419 Anxiety disorder, unspecified: Secondary | ICD-10-CM | POA: Diagnosis not present

## 2015-06-05 DIAGNOSIS — Z043 Encounter for examination and observation following other accident: Secondary | ICD-10-CM | POA: Diagnosis present

## 2015-06-05 DIAGNOSIS — Y999 Unspecified external cause status: Secondary | ICD-10-CM | POA: Insufficient documentation

## 2015-06-05 DIAGNOSIS — Z8742 Personal history of other diseases of the female genital tract: Secondary | ICD-10-CM | POA: Insufficient documentation

## 2015-06-05 DIAGNOSIS — F0390 Unspecified dementia without behavioral disturbance: Secondary | ICD-10-CM

## 2015-06-05 DIAGNOSIS — S51011A Laceration without foreign body of right elbow, initial encounter: Secondary | ICD-10-CM | POA: Diagnosis not present

## 2015-06-05 DIAGNOSIS — S51012A Laceration without foreign body of left elbow, initial encounter: Secondary | ICD-10-CM | POA: Diagnosis not present

## 2015-06-05 DIAGNOSIS — W19XXXA Unspecified fall, initial encounter: Secondary | ICD-10-CM

## 2015-06-05 DIAGNOSIS — J449 Chronic obstructive pulmonary disease, unspecified: Secondary | ICD-10-CM | POA: Insufficient documentation

## 2015-06-05 NOTE — ED Provider Notes (Signed)
CSN: [id number]     Arrival date & time 06/05/15  9528 History   First MD Initiated Contact with Patient 06/05/15 0422     Chief Complaint  Patient presents with  . Fall     (Consider location/radiation/quality/duration/timing/severity/associated sxs/prior Treatment) Patient is a 81 y.o. female presenting with fall. The history is provided by the nursing home and the EMS personnel. No language interpreter was used.  Fall This is a new problem. The current episode started today. Associated symptoms comments: Patient just d/c'd from the emergency department to [facility] returns after unwitnessed fall. Patient found on the ground, awake, and is reported to be at baseline mental status. No vomiting. The patient has a history of Alzheimer's dementia and cannot contribute to history. .    Past Medical History  Diagnosis Date  . Fistula     COLOVAGINAL  . Diverticulitis   . Other chronic cystitis   . Dysuria   . Incomplete bladder emptying   . Nocturia   . Postmenopausal atrophic vaginitis   . Other specified symptom associated with female genital organs   . Anxiety state, unspecified   . Personal history of arthritis   . Unspecified asthma(493.90)   . Chronic airway obstruction, not elsewhere classified   . Other persistent mental disorders due to conditions classified elsewhere   . Depressive disorder, not elsewhere classified   . Type II or unspecified type diabetes mellitus without mention of complication, not stated as uncontrolled   . Esophageal reflux   . Diaphragmatic hernia without mention of obstruction or gangrene   . Insomnia, unspecified   . Other chronic cystitis   . Postmenopausal atrophic vaginitis   . Dysuria   . Dementia    Past Surgical History  Procedure Laterality Date  . Appendectomy    . Hernia repair    . Cataract surgery    . Nissen fundoplication    . Hemorrhoid surgery    . Cystoscopy    . Left kidney sturgery for kidney stones    . Flexible  sigmoidoscopy  09/17/2011    Procedure: FLEXIBLE SIGMOIDOSCOPY;  Surgeon: Malissa Hippo, MD;  Location: [facility];  Service: Endoscopy;  Laterality: N/A;  8:30 am   No family history on file. History  Substance Use Topics  . Smoking status: Never Smoker   . Smokeless tobacco: Not on file  . Alcohol Use: No   OB History    No data available     Review of Systems  Unable to perform ROS: Dementia      Allergies  Lactose intolerance (gi); Ciprocin-fluocin-procin; Corticosteroids; Nitrofurantoin; Oxybutynin; Quinine sulfate; and Sulfa antibiotics  Home Medications   Prior to Admission medications   Medication Sig Start Date End Date Taking? Authorizing Provider  ALPRAZolam Prudy Feeler) 0.5 MG tablet Take 0.5 mg by mouth 3 (three) times daily. 8 a.m., 2 p.m., 8 p.m. 05/23/15  Yes Historical Provider, MD  alum & mag hydroxide-simeth (MAALOX/MYLANTA) 200-200-20 MG/5ML suspension Take 30 mLs by mouth every 6 (six) hours as needed for indigestion or heartburn.   Yes Historical Provider, MD  dicyclomine (BENTYL) 20 MG tablet Take 1 tablet by mouth 3 (three) times daily. 02/26/15  Yes Historical Provider, MD  divalproex (DEPAKOTE SPRINKLE) 125 MG capsule Take 500 mg by mouth 2 (two) times daily before lunch and supper.    Yes Historical Provider, MD  guaifenesin (ROBITUSSIN) 100 MG/5ML syrup Take 200 mg by mouth 3 (three) times daily as needed for cough.  Yes Historical Provider, MD  ipratropium-albuterol (DUONEB) 0.5-2.5 (3) MG/3ML SOLN Take 3 mLs by nebulization 4 (four) times daily. 05/02/15  Yes Nyoka Cowden, MD  loperamide (IMODIUM) 2 MG capsule Take 2 mg by mouth as needed for diarrhea or loose stools.   Yes Historical Provider, MD  LORazepam (ATIVAN) 0.5 MG tablet Take 0.25 mg by mouth every 4 (four) hours as needed for anxiety.   Yes Historical Provider, MD  magnesium hydroxide (MILK OF MAGNESIA) 400 MG/5ML suspension Take 30 mLs by mouth daily as needed for mild constipation.   Yes  Historical Provider, MD  Melatonin 3 MG CAPS Take 9 mg by mouth at bedtime.    Yes Historical Provider, MD  metFORMIN (GLUCOPHAGE) 500 MG tablet Take 500 mg by mouth 2 (two) times daily with a meal.    Yes Historical Provider, MD  mirtazapine (REMERON) 30 MG tablet Take 30 mg by mouth at bedtime.   Yes Historical Provider, MD  sertraline (ZOLOFT) 50 MG tablet Take 50 mg by mouth daily.   Yes Historical Provider, MD  Skin Protectants, Misc. (BAZA PROTECT EX) Apply 1 application topically 3 (three) times daily. Applied to sacrum   Yes Historical Provider, MD  temazepam (RESTORIL) 15 MG capsule Take 15 mg by mouth at bedtime.    Yes Historical Provider, MD  Vitamin D, Ergocalciferol, (DRISDOL) 50000 UNITS CAPS capsule Take 50,000 Units by mouth every 7 (seven) days. Tuesdays. D/C after 8 weeks.   Yes Historical Provider, MD  acetaminophen (TYLENOL) 500 MG tablet Take 500 mg by mouth every 6 (six) hours as needed for mild pain, fever or headache.    Historical Provider, MD  cephALEXin (KEFLEX) 500 MG capsule Take 1 capsule (500 mg total) by mouth 2 (two) times daily. Patient not taking: Reported on 06/05/2015 06/04/15   Antony Madura, PA-C   BP 123/61 mmHg  Pulse 76  Temp(Src) 97.8 F (36.6 C) (Oral)  Resp 18  SpO2 96% Physical Exam  Constitutional: She appears well-developed and well-nourished. No distress.  Frail elderly female in NAD  HENT:  Head: Normocephalic and atraumatic.  Neck: Normal range of motion. Neck supple.  Cardiovascular: Normal rate.   Pulmonary/Chest: Effort normal.  Abdominal: Soft. There is no tenderness. There is no rebound and no guarding.  Musculoskeletal: Normal range of motion.  No bony deformities. No withdrawal or apparent tenderness to palpation of any joint.   Neurological: She is alert.  Upper extremity contractures.   Skin: Skin is warm and dry. No rash noted.  Small skin tears - non-suturable - to bilateral elbows.   Psychiatric: She has a normal mood and  affect.    ED Course  Procedures (including critical care time) Labs Review Labs Reviewed - No data to display  Imaging Review Dg Chest 2 View  06/04/2015   CLINICAL DATA:  Fall. Patient was found on the floor today. No visible injury.  EXAM: CHEST  2 VIEW  COMPARISON:  05/19/2015  FINDINGS: Shallow inspiration with significant elevation of the right hemidiaphragm. Colonic interposition under the hemidiaphragm on the right. Appearance is similar to prior study. Probable linear atelectasis in both lung bases. No focal consolidation. Heart size and pulmonary vascularity are normal. Mediastinal contours appear intact. Calcified and tortuous aorta. No pneumothorax. Surgical clips in the left upper quadrant. Degenerative changes in the spine.  IMPRESSION: Shallow inspiration with prominent elevation of the right hemidiaphragm. Atelectasis in the lung bases. Appearance is similar to prior study.   Electronically Signed  By: Burman NievesWilliam  Stevens M.D.   On: 06/04/2015 22:01   Dg Pelvis 1-2 Views  06/04/2015   CLINICAL DATA:  81 year old female with a history of fall.  EXAM: PELVIS - 1-2 VIEW  COMPARISON:  04/22/2015  FINDINGS: Chronic changes of prior trauma involving the left superior and inferior pubic ramus, with callus formation.  No acute fracture identified.  Bilateral hips projects normally over the acetabula. Visualized aspects of the proximal femurs unremarkable.  Vascular calcifications.  IMPRESSION: No acute bony abnormality identified.  Posttraumatic changes of the left superior and inferior pubic ramus, similar to comparison.  Signed,  Yvone NeuJaime S. Loreta AveWagner, DO  [facility]  [facility]   Electronically Signed   By: Gilmer MorJaime  Wagner D.O.   On: 06/04/2015 22:00     EKG Interpretation None      MDM   Final diagnoses:  None    1. Fall, recurrent 2. Skin tears, elbows 3. Dementia, at baseline  No acute injury identified. Minor skin tears to elbows  that do not require suturing. [facility]     Elpidio AnisShari Larell Baney, PA-C 06/05/15 0617  Paula LibraJohn Molpus, MD 06/05/15 [phone]

## 2015-06-05 NOTE — ED Notes (Signed)
Per EMS pt from [facility], pt earlier tonight came to ER and sent back, fell again and was called out again to come, skin tear to L elbow and R FA, pt had unwitnessed fall this time, was found on floor, pt has alzheimer's, no mental status change.

## 2015-06-05 NOTE — ED Notes (Signed)
[facility] called to transport pt back home 

## 2015-06-05 NOTE — ED Notes (Signed)
Pt is confused, states "I was told I keep falling", pt in no distress.

## 2015-06-05 NOTE — ED Notes (Signed)
Bed: [id number] Expected date: 06/05/15 Expected time: 2:56 AM Means of arrival: Ambulance Comments: fall

## 2015-06-05 NOTE — ED Notes (Signed)
Skin tears to bil elbows cleaned and bandaid placed over

## 2015-06-05 NOTE — Discharge Instructions (Signed)
Skin Tear Care  A skin tear is a wound in which the top layer of skin has peeled off. This is a common problem with aging because the skin becomes thinner and more fragile as a person gets older. In addition, some medicines, such as oral corticosteroids, can lead to skin thinning if taken for long periods of time.   A skin tear is often repaired with tape or skin adhesive strips. This keeps the skin that has been peeled off in contact with the healthier skin beneath. Depending on the location of the wound, a bandage (dressing) may be applied over the tape or skin adhesive strips. Sometimes, during the healing process, the skin turns black and dies. Even when this happens, the torn skin acts as a good dressing until the skin underneath gets healthier and repairs itself.  HOME CARE INSTRUCTIONS   · Change dressings once per day or as directed by your caregiver.  ¨ Gently clean the skin tear and the area around the tear using saline solution or mild soap and water.  ¨ Do not rub the injured skin dry. Let the area air dry.  ¨ Apply petroleum jelly or an antibiotic cream or ointment to keep the tear moist. This will help the wound heal. Do not allow a scab to form.  ¨ If the dressing sticks before the next dressing change, moisten it with warm soapy water and gently remove it.  · Protect the injured skin until it has healed.  · Only take over-the-counter or prescription medicines as directed by your caregiver.  · Take showers or baths using warm soapy water. Apply a new dressing after the shower or bath.  · Keep all follow-up appointments as directed by your caregiver.    SEEK IMMEDIATE MEDICAL CARE IF:   · You have redness, swelling, or increasing pain in the skin tear.  · You have pus coming from the skin tear.  · You have chills.  · You have a red streak that goes away from the skin tear.  · You have a bad smell coming from the tear or dressing.  · You have a fever or persistent symptoms for more than 2-3 days.  · You  have a fever and your symptoms suddenly get worse.  MAKE SURE YOU:  · Understand these instructions.  · Will watch this condition.  · Will get help right away if your child is not doing well or gets worse.  Document Released: 08/05/2001 Document Revised: 08/04/2012 Document Reviewed: 05/24/2012  Exit[employer]® Patient Information ©2015 [employer]. This information is not intended to replace advice given to you by your health care provider. Make sure you discuss any questions you have with your health care provider.

## 2015-06-07 LAB — URINE CULTURE: Culture: >=100000

## 2015-06-08 ENCOUNTER — Telehealth (HOSPITAL_COMMUNITY): Payer: Self-pay

## 2015-06-08 NOTE — Progress Notes (Signed)
ED Antimicrobial Stewardship Positive Culture Follow Up   Thayer Dallashressa L Beightol is an 81 y.o. female who [facility] with a chief complaint of  Chief Complaint  Patient presents with  . Fall    Recent Results (from the past 720 hour(s))  Urine culture     Status: None   Collection Time: 06/04/15 10:40 PM  Result Value Ref Range Status   Specimen Description URINE, CATHETERIZED  Final   Special Requests NONE  Final   Culture >=100,000 COLONIES/mL ENTEROCOCCUS SPECIES  Final   Report Status 06/07/2015 FINAL  Final   Organism ID, Bacteria ENTEROCOCCUS SPECIES  Final      Susceptibility   Enterococcus species - MIC*    AMPICILLIN <=2 SENSITIVE Sensitive     LEVOFLOXACIN >=8 RESISTANT Resistant     NITROFURANTOIN <=16 SENSITIVE Sensitive     VANCOMYCIN 1 SENSITIVE Sensitive     LINEZOLID 2 SENSITIVE Sensitive     * >=100,000 COLONIES/mL ENTEROCOCCUS SPECIES    [x]  Treated with Keflex, organism not covered by the prescribed antimicrobial  New antibiotic prescription: Amoxicillin 500 mg twice daily for 7 days  ED Provider: Teressa LowerVrinda Pickering, NP  Rolley SimsMartin, Delainie Chavana Ann 06/08/2015, 8:13 AM Infectious Diseases Pharmacist Phone# [phone]

## 2015-06-18 ENCOUNTER — Telehealth: Payer: Self-pay | Admitting: *Deleted

## 2015-06-18 NOTE — Telephone Encounter (Signed)
PA submitted through cover my meds; Key: [id number]  Will await response.

## 2015-06-19 ENCOUNTER — Telehealth: Payer: Self-pay | Admitting: Internal Medicine

## 2015-06-19 NOTE — Telephone Encounter (Signed)
Called [employer] to verify medication approved.  Patient ID : [id number] Ipratropium-Albuterol approved x 1 year (06/18/15-06/17/16) ----- LMTCB x 1 for pt to make aware medication approved.

## 2015-06-19 NOTE — Telephone Encounter (Signed)
PA for Ipratropium-Albuterol approved. Your request has been approved  Effective from 06/18/2015 through 06/17/2016.  Pharmacy informed.

## 2015-06-20 NOTE — Telephone Encounter (Signed)
lmtcb x2 for pt. 

## 2015-06-21 NOTE — Telephone Encounter (Addendum)
lmtcb x3. Per our protocol, this message will be closed. If/when pt calls back a new message will need to be created. 

## 2015-07-26 DEATH — deceased
